# Patient Record
Sex: Male | Born: 1955 | Race: White | Hispanic: No | Marital: Married | Smoking: Never smoker
Health system: Southern US, Community
[De-identification: ages and names within clinical notes are randomized; demographics above are authoritative.]

## PROBLEM LIST (undated history)

## (undated) DIAGNOSIS — E785 Hyperlipidemia, unspecified: Secondary | ICD-10-CM

## (undated) DIAGNOSIS — T7840XA Allergy, unspecified, initial encounter: Secondary | ICD-10-CM

## (undated) DIAGNOSIS — G4730 Sleep apnea, unspecified: Secondary | ICD-10-CM

## (undated) HISTORY — DX: Sleep apnea, unspecified: G47.30

## (undated) HISTORY — DX: Allergy, unspecified, initial encounter: T78.40XA

## (undated) HISTORY — PX: PROSTATE BIOPSY: SHX241

## (undated) HISTORY — PX: REFRACTIVE SURGERY: SHX103

## (undated) HISTORY — PX: CATARACT EXTRACTION, BILATERAL: SHX1313

## (undated) HISTORY — PX: HERNIA REPAIR: SHX51

---

## 1993-10-08 HISTORY — PX: VASECTOMY: SHX75

## 1998-10-08 DIAGNOSIS — G4730 Sleep apnea, unspecified: Secondary | ICD-10-CM

## 1998-10-08 HISTORY — DX: Sleep apnea, unspecified: G47.30

## 2003-10-09 HISTORY — PX: HYDROCELE EXCISION / REPAIR: SUR1145

## 2008-10-08 HISTORY — PX: EYE SURGERY: SHX253

## 2009-10-08 HISTORY — PX: COLONOSCOPY: SHX174

## 2012-01-03 ENCOUNTER — Encounter (INDEPENDENT_AMBULATORY_CARE_PROVIDER_SITE_OTHER): Payer: Federal, State, Local not specified - PPO | Admitting: Ophthalmology

## 2012-01-03 DIAGNOSIS — H43819 Vitreous degeneration, unspecified eye: Secondary | ICD-10-CM

## 2012-01-03 DIAGNOSIS — H35419 Lattice degeneration of retina, unspecified eye: Secondary | ICD-10-CM

## 2012-01-03 DIAGNOSIS — H2510 Age-related nuclear cataract, unspecified eye: Secondary | ICD-10-CM

## 2012-01-03 DIAGNOSIS — H33309 Unspecified retinal break, unspecified eye: Secondary | ICD-10-CM

## 2012-01-18 ENCOUNTER — Ambulatory Visit (INDEPENDENT_AMBULATORY_CARE_PROVIDER_SITE_OTHER): Payer: Federal, State, Local not specified - PPO | Admitting: Ophthalmology

## 2012-01-18 DIAGNOSIS — H33309 Unspecified retinal break, unspecified eye: Secondary | ICD-10-CM

## 2012-02-18 ENCOUNTER — Encounter (INDEPENDENT_AMBULATORY_CARE_PROVIDER_SITE_OTHER): Payer: Federal, State, Local not specified - PPO | Admitting: Ophthalmology

## 2012-02-18 DIAGNOSIS — H33309 Unspecified retinal break, unspecified eye: Secondary | ICD-10-CM

## 2012-02-18 DIAGNOSIS — H2510 Age-related nuclear cataract, unspecified eye: Secondary | ICD-10-CM

## 2012-02-18 DIAGNOSIS — H43819 Vitreous degeneration, unspecified eye: Secondary | ICD-10-CM

## 2012-04-11 ENCOUNTER — Ambulatory Visit: Payer: Federal, State, Local not specified - PPO | Admitting: Cardiology

## 2012-05-19 ENCOUNTER — Ambulatory Visit (INDEPENDENT_AMBULATORY_CARE_PROVIDER_SITE_OTHER): Payer: Federal, State, Local not specified - PPO | Admitting: Ophthalmology

## 2012-05-19 DIAGNOSIS — H2510 Age-related nuclear cataract, unspecified eye: Secondary | ICD-10-CM

## 2012-05-19 DIAGNOSIS — H43819 Vitreous degeneration, unspecified eye: Secondary | ICD-10-CM

## 2012-05-19 DIAGNOSIS — H33309 Unspecified retinal break, unspecified eye: Secondary | ICD-10-CM

## 2013-06-04 ENCOUNTER — Ambulatory Visit (HOSPITAL_BASED_OUTPATIENT_CLINIC_OR_DEPARTMENT_OTHER): Payer: Federal, State, Local not specified - PPO | Admitting: Genetic Counselor

## 2013-06-04 ENCOUNTER — Ambulatory Visit: Payer: Federal, State, Local not specified - PPO | Admitting: Lab

## 2013-06-04 DIAGNOSIS — IMO0002 Reserved for concepts with insufficient information to code with codable children: Secondary | ICD-10-CM

## 2013-06-04 DIAGNOSIS — Z803 Family history of malignant neoplasm of breast: Secondary | ICD-10-CM

## 2013-06-05 ENCOUNTER — Encounter: Payer: Federal, State, Local not specified - PPO | Admitting: Genetic Counselor

## 2013-06-11 ENCOUNTER — Telehealth: Payer: Self-pay | Admitting: Genetic Counselor

## 2013-06-11 NOTE — Telephone Encounter (Signed)
Left good news message.

## 2013-06-12 ENCOUNTER — Encounter: Payer: Self-pay | Admitting: Genetic Counselor

## 2013-06-12 NOTE — Progress Notes (Signed)
Wesley Becker, a 57 y.o. male, was seen with his wife for discussion of his family history of breast cancer and Ahskenazi Jewish ancestry.  He presents to clinic today to discuss the possibility of a genetic predisposition to cancer, and to further clarify his risks, as well as his family members' risks for cancer.   HISTORY OF PRESENT ILLNESS: Wesley Becker is a 57 y.o. male with no personal history of cancer.  Mr. Cuny reports having Ashkenazi Jewish ancestry on both sides of his family.  History reviewed. No pertinent past medical history.  History reviewed. No pertinent past surgical history.  History   Social History  . Marital Status: Married    Spouse Name: N/A    Number of Children: 1  . Years of Education: N/A   Social History Main Topics  . Smoking status: Never Smoker   . Smokeless tobacco: None  . Alcohol Use: None  . Drug Use: None  . Sexual Activity: None   Other Topics Concern  . None   Social History Narrative  . None    FAMILY HISTORY:  We obtained a detailed, 4-generation family history.  Significant diagnoses are listed below: Family History  Problem Relation Age of Onset  . Lymphoma Father     dx in his 58s  . Heart disease Brother 38  . Heart disease Maternal Uncle 46  . Breast cancer Paternal Aunt     dx in her 60s  . Cancer Paternal Grandmother     gall bladder cancer    Patient's maternal ancestors are of [place] descent, and paternal ancestors are of [place] descent. There is reported Ashkenazi Jewish ancestry. There is no known consanguinity.  GENETIC COUNSELING ASSESSMENT: Wesley Becker is a 57 y.o. male with a family history of breast, lymphoma and gallbladder cancer and Ashkenazi Jewish ancestry which somewhat suggestive of a hereditary breast and ovarian cancer syndrome and predisposition to cancer. We, therefore, discussed and recommended the following at today's visit.   DISCUSSION: We reviewed the  characteristics, features and inheritance patterns of hereditary cancer syndromes. We also discussed genetic testing, including the appropriate family members to test, the process of testing, insurance coverage and turn-around-time for results. There are 3 common mutations with the Ashkenazi Jewish population.  Per the literature, about 90% of BRCA mutation found in this population are one of these three.  It is estimated that about 1 in 65 women of Ashkenazi Jewish ancestry with breast cancer have a BRCA mutation.    In order to estimate his chance of having a BRCA mutation, we used statistical models (Penn II and Myriad risk calculator) and laboratory data that take into account his personal medical history, family history and ancestry.  Because each model is different, there can be a lot of variability in the risks they give.  Therefore, these numbers must be considered a rough range and not a precise risk of having a BRCA mutation.  These models estimate that he has approximately a 3-13% chance of having a mutation. Based on this assessment of her family and personal history, genetic testing is recommended.  PLAN: After considering the risks, benefits, and limitations, Wesley Becker provided informed consent to pursue genetic testing and the blood sample will be sent to [facility] for analysis of the multisite BRCA testing. We discussed the implications of a positive, negative and/ or variant of uncertain significance genetic test result. Results should be available within approximately 7-10 days' time, at which point  they will be disclosed by telephone to Wesley Becker, as will any additional recommendations warranted by these results. Wesley Becker will receive a summary of his genetic counseling visit and a copy of his results once available. This information will also be available in Epic. We encouraged Wesley Becker to remain in contact with cancer genetics annually so that we can continuously  update the family history and inform him of any changes in cancer genetics and testing that may be of benefit for his family. Wesley Becker questions were answered to his satisfaction today. Our contact information was provided should additional questions or concerns arise.  The patient was seen for a total of 60 minutes, greater than 50% of which was spent face-to-face counseling.  This plan is being carried out per Dr. Feliz Beam recommendations.  This note will also be sent to the referring provider via the electronic medical record. The patient will be supplied with a summary of this genetic counseling discussion as well as educational information on the discussed hereditary cancer syndromes following the conclusion of their visit.   Patient was discussed with Dr. Drue Becker.   _______________________________________________________________________ For Office Staff:  Number of people involved in session: 2 Was an Intern/ student involved with case: no

## 2013-06-30 ENCOUNTER — Encounter: Payer: Self-pay | Admitting: Genetic Counselor

## 2013-06-30 ENCOUNTER — Telehealth: Payer: Self-pay | Admitting: Genetic Counselor

## 2013-06-30 NOTE — Telephone Encounter (Signed)
Left message to see if they had any qeustions about his genetic test results

## 2014-02-09 ENCOUNTER — Other Ambulatory Visit (HOSPITAL_COMMUNITY): Payer: Self-pay | Admitting: Orthopaedic Surgery

## 2014-02-09 DIAGNOSIS — M25512 Pain in left shoulder: Secondary | ICD-10-CM

## 2014-02-09 DIAGNOSIS — M7542 Impingement syndrome of left shoulder: Secondary | ICD-10-CM

## 2014-02-12 ENCOUNTER — Ambulatory Visit (HOSPITAL_COMMUNITY): Admission: RE | Admit: 2014-02-12 | Payer: Federal, State, Local not specified - PPO | Source: Ambulatory Visit

## 2014-02-17 ENCOUNTER — Ambulatory Visit (HOSPITAL_COMMUNITY)
Admission: RE | Admit: 2014-02-17 | Discharge: 2014-02-17 | Disposition: A | Discharge status: Home / Self Care | Payer: Federal, State, Local not specified - PPO | Source: Ambulatory Visit | Attending: Orthopaedic Surgery | Admitting: Orthopaedic Surgery

## 2014-02-17 DIAGNOSIS — M25519 Pain in unspecified shoulder: Secondary | ICD-10-CM | POA: Insufficient documentation

## 2014-02-17 DIAGNOSIS — M6580 Other synovitis and tenosynovitis, unspecified site: Secondary | ICD-10-CM | POA: Insufficient documentation

## 2014-02-17 DIAGNOSIS — M67919 Unspecified disorder of synovium and tendon, unspecified shoulder: Secondary | ICD-10-CM | POA: Insufficient documentation

## 2014-02-17 DIAGNOSIS — M719 Bursopathy, unspecified: Secondary | ICD-10-CM | POA: Insufficient documentation

## 2014-07-07 ENCOUNTER — Emergency Department (HOSPITAL_COMMUNITY)
Admission: EM | Admit: 2014-07-07 | Discharge: 2014-07-07 | Disposition: A | Discharge status: Home / Self Care | Payer: Federal, State, Local not specified - PPO | Attending: Emergency Medicine | Admitting: Emergency Medicine

## 2014-07-07 ENCOUNTER — Emergency Department (HOSPITAL_COMMUNITY): Payer: Federal, State, Local not specified - PPO

## 2014-07-07 ENCOUNTER — Encounter (HOSPITAL_COMMUNITY): Payer: Self-pay | Admitting: Emergency Medicine

## 2014-07-07 DIAGNOSIS — E785 Hyperlipidemia, unspecified: Secondary | ICD-10-CM | POA: Insufficient documentation

## 2014-07-07 DIAGNOSIS — R079 Chest pain, unspecified: Secondary | ICD-10-CM | POA: Insufficient documentation

## 2014-07-07 DIAGNOSIS — Z79899 Other long term (current) drug therapy: Secondary | ICD-10-CM | POA: Diagnosis not present

## 2014-07-07 DIAGNOSIS — R0789 Other chest pain: Secondary | ICD-10-CM | POA: Diagnosis not present

## 2014-07-07 HISTORY — DX: Hyperlipidemia, unspecified: E78.5

## 2014-07-07 LAB — COMPREHENSIVE METABOLIC PANEL
ALK PHOS: 36 U/L — AB (ref 39–117)
ALT: 63 U/L — ABNORMAL HIGH (ref 0–53)
ANION GAP: 11 (ref 5–15)
AST: 31 U/L (ref 0–37)
Albumin: 4.2 g/dL (ref 3.5–5.2)
BILIRUBIN TOTAL: 0.6 mg/dL (ref 0.3–1.2)
BUN: 13 mg/dL (ref 6–23)
CHLORIDE: 102 meq/L (ref 96–112)
CO2: 28 meq/L (ref 19–32)
Calcium: 9.3 mg/dL (ref 8.4–10.5)
Creatinine, Ser: 0.94 mg/dL (ref 0.50–1.35)
GFR calc non Af Amer: >90 mL/min (ref >90)
GLUCOSE: 121 mg/dL — AB (ref 70–99)
Potassium: 4 mEq/L (ref 3.7–5.3)
Sodium: 141 mEq/L (ref 137–147)
TOTAL PROTEIN: 7.4 g/dL (ref 6.0–8.3)

## 2014-07-07 LAB — CBC WITH DIFFERENTIAL/PLATELET
Basophils Absolute: 0 10*3/uL (ref 0.0–0.1)
Basophils Relative: 0 % (ref 0–1)
Eosinophils Absolute: 0.2 10*3/uL (ref 0.0–0.7)
Eosinophils Relative: 3 % (ref 0–5)
HCT: 42.1 % (ref 39.0–52.0)
HEMOGLOBIN: 14.6 g/dL (ref 13.0–17.0)
LYMPHS ABS: 2.1 10*3/uL (ref 0.7–4.0)
LYMPHS PCT: 33 % (ref 12–46)
MCH: 29.1 pg (ref 26.0–34.0)
MCHC: 34.7 g/dL (ref 30.0–36.0)
MCV: 83.9 fL (ref 78.0–100.0)
MONOS PCT: 6 % (ref 3–12)
Monocytes Absolute: 0.4 10*3/uL (ref 0.1–1.0)
NEUTROS ABS: 3.6 10*3/uL (ref 1.7–7.7)
Neutrophils Relative %: 58 % (ref 43–77)
PLATELETS: 174 10*3/uL (ref 150–400)
RBC: 5.02 MIL/uL (ref 4.22–5.81)
RDW: 12.9 % (ref 11.5–15.5)
WBC: 6.3 10*3/uL (ref 4.0–10.5)

## 2014-07-07 LAB — TROPONIN I

## 2014-07-07 MED ORDER — KETOROLAC TROMETHAMINE 30 MG/ML IJ SOLN
30.0000 mg | Freq: Once | INTRAMUSCULAR | Status: DC
  Filled 2014-07-07: qty 1

## 2014-07-07 MED ORDER — IOHEXOL 350 MG/ML SOLN
100.0000 mL | Freq: Once | INTRAVENOUS | Status: AC | PRN
  Administered 2014-07-07: 100 mL via INTRAVENOUS

## 2014-07-07 MED ORDER — SODIUM CHLORIDE 0.9 % IV SOLN
INTRAVENOUS | Status: DC
  Administered 2014-07-07: 21:00:00 via INTRAVENOUS

## 2014-07-07 NOTE — ED Notes (Signed)
Pt out of room for CT 

## 2014-07-07 NOTE — ED Notes (Signed)
Pt reports sudden onset R side chest pain while sitting down, not tender to palpation. No SOB, no weakness, no N/V. Pt reports taking 162 mg aspirin prior to arrival.

## 2014-07-07 NOTE — Discharge Instructions (Signed)

## 2014-07-07 NOTE — ED Provider Notes (Signed)
CSN: [id number]     Arrival date & time 07/07/14  2036 History   First MD Initiated Contact with Patient 07/07/14 2044     Chief Complaint  Patient presents with  . Chest Pain     (Consider location/radiation/quality/duration/timing/severity/associated sxs/prior Treatment) HPI Comments: Patient here with constant right-sided chest pain or that began approximately 5 hours prior to arrival. Patient was sitting down when he developed sharp pain that was worse with movement and taking a deep breath. Denies any recent leg pain or swelling. Denies any dyspnea. No fever or cough. Denies any associated diaphoresis or nausea or vomiting. Symptoms have been improving since he took aspirin. No prior history of chest trauma. No rashes noted.  Patient is a 58 y.o. male presenting with chest pain. The history is provided by the patient.  Chest Pain   Past Medical History  Diagnosis Date  . Hyperlipidemia    Past Surgical History  Procedure Laterality Date  . Prostate biopsy    . Hernia repair    . Refractive surgery     Family History  Problem Relation Age of Onset  . Lymphoma Father     dx in his 42s  . Heart disease Brother 27  . Heart disease Maternal Uncle 46  . Breast cancer Paternal Aunt     dx in her 8s  . Cancer Paternal Grandmother     gall bladder cancer   History  Substance Use Topics  . Smoking status: Never Smoker   . Smokeless tobacco: Not on file  . Alcohol Use: Yes     Comment: rarely    Review of Systems  Cardiovascular: Positive for chest pain.  All other systems reviewed and are negative.     Allergies  Review of patient's allergies indicates no known allergies.  Home Medications   Prior to Admission medications   Medication Sig Start Date End Date Taking? Authorizing Provider  simvastatin (ZOCOR) 40 MG tablet Take 40 mg by mouth at bedtime.   Yes Historical Provider, MD  tacrolimus (PROTOPIC) 0.1 % ointment Apply 1 drop topically daily as needed  (under eyes for runny eyes.).   Yes Historical Provider, MD  traZODone (DESYREL) 50 MG tablet Take 50 mg by mouth at bedtime.   Yes Historical Provider, MD  zolpidem (AMBIEN) 5 MG tablet Take 5 mg by mouth at bedtime as needed for sleep.   Yes Historical Provider, MD   BP 134/91  Pulse 79  Temp(Src) 97.9 F (36.6 C) (Oral)  Resp 18  Ht 6' 3.5" (1.918 m)  Wt 218 lb (98.884 kg)  BMI 26.88 kg/m2  SpO2 100% Physical Exam  Nursing note and vitals reviewed. Constitutional: He is oriented to person, place, and time. He appears well-developed and well-nourished.  Non-toxic appearance. No distress.  HENT:  Head: Normocephalic and atraumatic.  Eyes: Conjunctivae, EOM and lids are normal. Pupils are equal, round, and reactive to light.  Neck: Normal range of motion. Neck supple. No tracheal deviation present. No mass present.  Cardiovascular: Normal rate, regular rhythm and normal heart sounds.  Exam reveals no gallop.   No murmur heard. Pulmonary/Chest: Effort normal and breath sounds normal. No stridor. No respiratory distress. He has no decreased breath sounds. He has no wheezes. He has no rhonchi. He has no rales.  Abdominal: Soft. Normal appearance and bowel sounds are normal. He exhibits no distension. There is no tenderness. There is no rebound and no CVA tenderness.  Musculoskeletal: Normal range of motion. He exhibits  no edema and no tenderness.  Neurological: He is alert and oriented to person, place, and time. He has normal strength. No cranial nerve deficit or sensory deficit. GCS eye subscore is 4. GCS verbal subscore is 5. GCS motor subscore is 6.  Skin: Skin is warm and dry. No abrasion and no rash noted.  Psychiatric: He has a normal mood and affect. His speech is normal and behavior is normal.    ED Course  Procedures (including critical care time) Labs Review Labs Reviewed  CBC WITH DIFFERENTIAL  TROPONIN I  COMPREHENSIVE METABOLIC PANEL    Imaging Review No results  found.   EKG Interpretation   Date/Time:  Wednesday July 07 2014 20:45:21 EDT Ventricular Rate:  79 PR Interval:  178 QRS Duration: 100 QT Interval:  406 QTC Calculation: 465 R Axis:   -20 Text Interpretation:  Sinus rhythm Borderline left axis deviation RSR' in  V1 or V2, right VCD or RVH Confirmed by Bristol Soy  MD, Sherriann Szuch ([id number]) on  07/07/2014 9:19:22 PM      MDM   Final diagnoses:  None   Patient with negative chest CT for PE here. Troponin is negative. Symptoms have subsided. I do not think that this represents ACS. Symptoms are atypical and will be given cardiology referral.     Leota Jacobsen, MD 07/07/14 2244

## 2014-08-18 ENCOUNTER — Encounter: Payer: Self-pay | Admitting: *Deleted

## 2014-08-25 ENCOUNTER — Ambulatory Visit (INDEPENDENT_AMBULATORY_CARE_PROVIDER_SITE_OTHER): Payer: Federal, State, Local not specified - PPO | Admitting: Cardiology

## 2014-08-25 ENCOUNTER — Encounter: Payer: Self-pay | Admitting: Cardiology

## 2014-08-25 VITALS — BP 110/78 | HR 74 | Ht 75.0 in | Wt 219.0 lb

## 2014-08-25 DIAGNOSIS — R0789 Other chest pain: Secondary | ICD-10-CM

## 2014-08-25 DIAGNOSIS — R072 Precordial pain: Secondary | ICD-10-CM

## 2014-08-25 NOTE — Progress Notes (Signed)
HPI The patient presents for evaluation of chest pain. He had this on the last day in September. This was a mid discomfort that was somewhat sharp. It occurred at rest. It was 4/10. He did not have radiation although he has some bursitis in his left shoulder so it is difficult to assess. There was no associated nausea vomiting or diaphoresis. He was not particularly short of breath. He may have had this kind of discomfort infrequently in the past. He did present to the emergency room and I reviewed these records. One enzyme was negative. EKG was not acute. Pulmonary CT angiogram demonstrated no pulmonary embolism. He had resolution of his symptoms spontaneously after about 5 hours he's had no recurrence of this. He is able to do things like walk up and down stairs quite frequently. He has been under increased stress as he is taking care of his wife who has stage IV breast cancer.  No Known Allergies  Current Outpatient Prescriptions  Medication Sig Dispense Refill  . AFLURIA PRESERVATIVE FREE 0.5 ML SUSY     . simvastatin (ZOCOR) 40 MG tablet Take 40 mg by mouth at bedtime.    . tacrolimus (PROTOPIC) 0.1 % ointment Apply 1 drop topically daily as needed (under eyes for runny eyes.).    Marland Kitchen traZODone (DESYREL) 50 MG tablet Take 50 mg by mouth at bedtime.    Marland Kitchen zolpidem (AMBIEN) 5 MG tablet Take 5 mg by mouth at bedtime as needed for sleep.     No current facility-administered medications for this visit.    Past Medical History  Diagnosis Date  . Hyperlipidemia     Past Surgical History  Procedure Laterality Date  . Prostate biopsy    . Hernia repair    . Refractive surgery      Family History  Problem Relation Age of Onset  . Lymphoma Father     dx in his 64s  . Heart disease Brother 23  . Heart disease Maternal Uncle 46  . Breast cancer Paternal Aunt     dx in her 21s  . Cancer Paternal Grandmother     gall bladder cancer    History   Social History  . Marital Status:  Married    Spouse Name: N/A    Number of Children: 1  . Years of Education: N/A   Occupational History  . Not on file.   Social History Main Topics  . Smoking status: Never Smoker   . Smokeless tobacco: Not on file  . Alcohol Use: Yes     Comment: rarely  . Drug Use: No  . Sexual Activity: Not on file   Other Topics Concern  . Not on file   Social History Narrative    ROS:  As stated in the HPI and negative for all other systems.  PHYSICAL EXAM BP 110/78 mmHg  Pulse 74  Ht 6\' 3"  (1.905 m)  Wt 219 lb (99.338 kg)  BMI 27.37 kg/m2  GENERAL:  Well appearing HEENT:  Pupils equal round and reactive, fundi not visualized, oral mucosa unremarkable NECK:  No jugular venous distention, waveform within normal limits, carotid upstroke brisk and symmetric, no bruits, no thyromegaly LYMPHATICS:  No cervical, inguinal adenopathy LUNGS:  Clear to auscultation bilaterally BACK:  No CVA tenderness CHEST:  Unremarkable HEART:  PMI not displaced or sustained,S1 and S2 within normal limits, no S3, no S4, no clicks, no rubs, no murmurs ABD:  Flat, positive bowel sounds normal in frequency in pitch,  no bruits, no rebound, no guarding, no midline pulsatile mass, no hepatomegaly, no splenomegaly EXT:  2 plus pulses throughout, no edema, no cyanosis no clubbing SKIN:  No rashes no nodules NEURO:  Cranial nerves II through XII grossly intact, motor grossly intact throughout PSYCH:  Cognitively intact, oriented to person place and time   EKG:   Sinus rhythm, rate 74, leftward axis, early transition, baseline artifact, no acute ST-T wave changes. 08/25/2014   ASSESSMENT AND PLAN  CHEST PAIN:  This was atypical. However, he does have a significant family history. Given this screening stress testing is indicated. I will bring the patient back for a POET (Plain Old Exercise Test). This will allow me to screen for obstructive coronary disease, risk stratify and very importantly provide a  prescription for exercise.  I did discuss with him that I would periodically screen him with treadmill test and perhaps calcium scores given his family history. We discussed the importance of primary risk reduction.  DYSLIPIDEMIA:  I reviewed his LDL from May which demonstrated a level of 106 and HDL 40. He will continue on the meds as listed.

## 2014-08-25 NOTE — Patient Instructions (Signed)
Your physician recommends that you schedule a follow-up appointment in: one year with Dr. Percival Spanish  We are ordering a treadmill test for you to get done

## 2014-09-16 ENCOUNTER — Telehealth (HOSPITAL_COMMUNITY): Payer: Self-pay

## 2014-09-16 NOTE — Telephone Encounter (Signed)
Encounter complete. 

## 2014-09-21 ENCOUNTER — Ambulatory Visit (HOSPITAL_COMMUNITY)
Admission: RE | Admit: 2014-09-21 | Discharge: 2014-09-21 | Disposition: A | Discharge status: Home / Self Care | Payer: Federal, State, Local not specified - PPO | Source: Ambulatory Visit | Attending: Cardiology | Admitting: Cardiology

## 2014-09-21 DIAGNOSIS — R079 Chest pain, unspecified: Secondary | ICD-10-CM | POA: Diagnosis present

## 2014-09-21 DIAGNOSIS — R0789 Other chest pain: Secondary | ICD-10-CM

## 2014-09-21 NOTE — Procedures (Signed)
Exercise Treadmill Test  Test  Exercise Tolerance Test Ordering MD: Marijo File, MD  Interpreting MD:   Unique Test No:1 Treadmill:  1  Indication for ETT: chest pain - rule out ischemia  Contraindication to ETT: No   Stress Modality: exercise - treadmill  Cardiac Imaging Performed: non   Protocol: standard Bruce - maximal  Max BP:  155/75  Max MPHR (bpm):  164 85% MPR (bpm):  139  MPHR obtained (bpm): 181 % MPHR obtained:  111  Reached 85% MPHR (min:sec):  9:10 Total Exercise Time (min-sec):  12:11  Workload in METS: 13.70 Borg Scale: Not recorded  Reason ETT Terminated:  fatigue    ST Segment Analysis At Rest: normal ST segments - no evidence of significant ST depression With Exercise: no evidence of significant ST depression  Other Information Arrhythmia:  No Angina during ETT:  absent (0) Quality of ETT:  diagnostic  ETT Interpretation:  normal - no evidence of ischemia by ST analysis  Comments: The patient had an good exercise tolerance.  There was no chest pain.  There was an appropriate level of dyspnea.  There were no arrhythmias, a normal heart rate response and normal BP response.  There were no ischemic ST T wave changes and a normal heart rate recovery.  Recommendations: Negative adequate POET (Plain Old Exercise Treadmill).  No further cardiovascular testing.

## 2015-04-14 ENCOUNTER — Other Ambulatory Visit: Payer: Self-pay | Admitting: Gastroenterology

## 2015-07-12 ENCOUNTER — Encounter (HOSPITAL_COMMUNITY): Payer: Self-pay

## 2015-07-12 ENCOUNTER — Ambulatory Visit (HOSPITAL_COMMUNITY): Admit: 2015-07-12 | Payer: Self-pay | Admitting: Gastroenterology

## 2015-07-12 SURGERY — COLONOSCOPY WITH PROPOFOL
Anesthesia: Monitor Anesthesia Care

## 2015-09-20 ENCOUNTER — Telehealth: Payer: Self-pay | Admitting: Cardiology

## 2015-09-20 NOTE — Telephone Encounter (Signed)
Returned call to patient.He stated he has appointment with Dr.Hochrein 09/22/15.Stated he wanted to ask Dr.Hochrein if he needs to keep appointment since he is doing good,no chest pain.Treadmill was normal last year.Stated he wife died in 02/21/2023 and he was under a lot of stress last year.Message sent to [place].

## 2015-09-20 NOTE — Telephone Encounter (Signed)
Please call,pt says he have some questions. He said he is not sure if he needs his appointment on Thursday.

## 2015-09-21 NOTE — Telephone Encounter (Signed)
Mr. Wesley Becker is calling back to see if he should come in to see Dr. Percival Spanish on 09/22/15.Marland Kitchen Please call him on his cell at  [phone]  thanks

## 2015-09-21 NOTE — Telephone Encounter (Signed)
Looks like the appt was already canceled.  I am fine with that.

## 2015-09-22 ENCOUNTER — Ambulatory Visit: Payer: Federal, State, Local not specified - PPO | Admitting: Cardiology

## 2016-01-24 DIAGNOSIS — K080 Exfoliation of teeth due to systemic causes: Secondary | ICD-10-CM | POA: Diagnosis not present

## 2016-02-07 DIAGNOSIS — R972 Elevated prostate specific antigen [PSA]: Secondary | ICD-10-CM | POA: Diagnosis not present

## 2016-02-13 DIAGNOSIS — R972 Elevated prostate specific antigen [PSA]: Secondary | ICD-10-CM | POA: Diagnosis not present

## 2016-02-13 DIAGNOSIS — Z0000 Encounter for general adult medical examination without abnormal findings: Secondary | ICD-10-CM | POA: Diagnosis not present

## 2016-02-14 ENCOUNTER — Other Ambulatory Visit (HOSPITAL_COMMUNITY): Payer: Self-pay | Admitting: Urology

## 2016-02-14 DIAGNOSIS — R972 Elevated prostate specific antigen [PSA]: Secondary | ICD-10-CM

## 2016-02-15 DIAGNOSIS — R972 Elevated prostate specific antigen [PSA]: Secondary | ICD-10-CM | POA: Diagnosis not present

## 2016-02-28 ENCOUNTER — Ambulatory Visit (HOSPITAL_COMMUNITY): Payer: Federal, State, Local not specified - PPO

## 2016-03-13 DIAGNOSIS — E7800 Pure hypercholesterolemia, unspecified: Secondary | ICD-10-CM | POA: Diagnosis not present

## 2016-03-13 DIAGNOSIS — Z0000 Encounter for general adult medical examination without abnormal findings: Secondary | ICD-10-CM | POA: Diagnosis not present

## 2016-04-11 DIAGNOSIS — L237 Allergic contact dermatitis due to plants, except food: Secondary | ICD-10-CM | POA: Diagnosis not present

## 2016-04-13 DIAGNOSIS — L218 Other seborrheic dermatitis: Secondary | ICD-10-CM | POA: Diagnosis not present

## 2016-04-13 DIAGNOSIS — D2271 Melanocytic nevi of right lower limb, including hip: Secondary | ICD-10-CM | POA: Diagnosis not present

## 2016-04-13 DIAGNOSIS — L821 Other seborrheic keratosis: Secondary | ICD-10-CM | POA: Diagnosis not present

## 2016-05-07 DIAGNOSIS — K080 Exfoliation of teeth due to systemic causes: Secondary | ICD-10-CM | POA: Diagnosis not present

## 2016-05-21 DIAGNOSIS — R972 Elevated prostate specific antigen [PSA]: Secondary | ICD-10-CM | POA: Diagnosis not present

## 2016-05-29 DIAGNOSIS — M76821 Posterior tibial tendinitis, right leg: Secondary | ICD-10-CM | POA: Diagnosis not present

## 2016-06-20 DIAGNOSIS — H31019 Macula scars of posterior pole (postinflammatory) (post-traumatic), unspecified eye: Secondary | ICD-10-CM | POA: Diagnosis not present

## 2016-06-20 DIAGNOSIS — H43813 Vitreous degeneration, bilateral: Secondary | ICD-10-CM | POA: Diagnosis not present

## 2016-06-20 DIAGNOSIS — H25013 Cortical age-related cataract, bilateral: Secondary | ICD-10-CM | POA: Diagnosis not present

## 2016-06-20 DIAGNOSIS — H2513 Age-related nuclear cataract, bilateral: Secondary | ICD-10-CM | POA: Diagnosis not present

## 2016-06-20 DIAGNOSIS — D3131 Benign neoplasm of right choroid: Secondary | ICD-10-CM | POA: Diagnosis not present

## 2016-06-20 DIAGNOSIS — H35413 Lattice degeneration of retina, bilateral: Secondary | ICD-10-CM | POA: Diagnosis not present

## 2016-08-15 DIAGNOSIS — K080 Exfoliation of teeth due to systemic causes: Secondary | ICD-10-CM | POA: Diagnosis not present

## 2016-08-16 DIAGNOSIS — K080 Exfoliation of teeth due to systemic causes: Secondary | ICD-10-CM | POA: Diagnosis not present

## 2016-09-13 DIAGNOSIS — H43393 Other vitreous opacities, bilateral: Secondary | ICD-10-CM | POA: Diagnosis not present

## 2016-09-13 DIAGNOSIS — H25013 Cortical age-related cataract, bilateral: Secondary | ICD-10-CM | POA: Diagnosis not present

## 2016-09-13 DIAGNOSIS — H2513 Age-related nuclear cataract, bilateral: Secondary | ICD-10-CM | POA: Diagnosis not present

## 2016-11-14 DIAGNOSIS — H2513 Age-related nuclear cataract, bilateral: Secondary | ICD-10-CM | POA: Diagnosis not present

## 2016-11-27 DIAGNOSIS — H2512 Age-related nuclear cataract, left eye: Secondary | ICD-10-CM | POA: Diagnosis not present

## 2016-12-04 DIAGNOSIS — H2511 Age-related nuclear cataract, right eye: Secondary | ICD-10-CM | POA: Diagnosis not present

## 2016-12-17 DIAGNOSIS — K080 Exfoliation of teeth due to systemic causes: Secondary | ICD-10-CM | POA: Diagnosis not present

## 2017-02-05 DIAGNOSIS — N400 Enlarged prostate without lower urinary tract symptoms: Secondary | ICD-10-CM | POA: Diagnosis not present

## 2017-02-13 DIAGNOSIS — N400 Enlarged prostate without lower urinary tract symptoms: Secondary | ICD-10-CM | POA: Diagnosis not present

## 2017-02-13 DIAGNOSIS — R972 Elevated prostate specific antigen [PSA]: Secondary | ICD-10-CM | POA: Diagnosis not present

## 2017-03-13 DIAGNOSIS — H2513 Age-related nuclear cataract, bilateral: Secondary | ICD-10-CM | POA: Diagnosis not present

## 2017-03-14 DIAGNOSIS — F418 Other specified anxiety disorders: Secondary | ICD-10-CM | POA: Diagnosis not present

## 2017-03-14 DIAGNOSIS — Z0000 Encounter for general adult medical examination without abnormal findings: Secondary | ICD-10-CM | POA: Diagnosis not present

## 2017-03-14 DIAGNOSIS — F5104 Psychophysiologic insomnia: Secondary | ICD-10-CM | POA: Diagnosis not present

## 2017-03-14 DIAGNOSIS — E7800 Pure hypercholesterolemia, unspecified: Secondary | ICD-10-CM | POA: Diagnosis not present

## 2017-04-03 DIAGNOSIS — K080 Exfoliation of teeth due to systemic causes: Secondary | ICD-10-CM | POA: Diagnosis not present

## 2017-04-15 DIAGNOSIS — L821 Other seborrheic keratosis: Secondary | ICD-10-CM | POA: Diagnosis not present

## 2017-04-15 DIAGNOSIS — L570 Actinic keratosis: Secondary | ICD-10-CM | POA: Diagnosis not present

## 2017-04-15 DIAGNOSIS — L738 Other specified follicular disorders: Secondary | ICD-10-CM | POA: Diagnosis not present

## 2017-04-15 DIAGNOSIS — L218 Other seborrheic dermatitis: Secondary | ICD-10-CM | POA: Diagnosis not present

## 2017-06-24 DIAGNOSIS — H35413 Lattice degeneration of retina, bilateral: Secondary | ICD-10-CM | POA: Diagnosis not present

## 2017-06-24 DIAGNOSIS — H43813 Vitreous degeneration, bilateral: Secondary | ICD-10-CM | POA: Diagnosis not present

## 2017-06-24 DIAGNOSIS — H43393 Other vitreous opacities, bilateral: Secondary | ICD-10-CM | POA: Diagnosis not present

## 2017-06-24 DIAGNOSIS — H31012 Macula scars of posterior pole (postinflammatory) (post-traumatic), left eye: Secondary | ICD-10-CM | POA: Diagnosis not present

## 2017-06-24 DIAGNOSIS — H26493 Other secondary cataract, bilateral: Secondary | ICD-10-CM | POA: Diagnosis not present

## 2017-06-24 DIAGNOSIS — D3131 Benign neoplasm of right choroid: Secondary | ICD-10-CM | POA: Diagnosis not present

## 2017-07-04 DIAGNOSIS — K080 Exfoliation of teeth due to systemic causes: Secondary | ICD-10-CM | POA: Diagnosis not present

## 2017-09-09 DIAGNOSIS — K080 Exfoliation of teeth due to systemic causes: Secondary | ICD-10-CM | POA: Diagnosis not present

## 2017-10-14 DIAGNOSIS — K080 Exfoliation of teeth due to systemic causes: Secondary | ICD-10-CM | POA: Diagnosis not present

## 2017-12-31 ENCOUNTER — Encounter: Payer: Self-pay | Admitting: Podiatry

## 2017-12-31 ENCOUNTER — Other Ambulatory Visit: Payer: Self-pay | Admitting: Podiatry

## 2017-12-31 ENCOUNTER — Ambulatory Visit: Payer: Federal, State, Local not specified - PPO | Admitting: Podiatry

## 2017-12-31 ENCOUNTER — Ambulatory Visit (INDEPENDENT_AMBULATORY_CARE_PROVIDER_SITE_OTHER): Payer: Federal, State, Local not specified - PPO

## 2017-12-31 VITALS — BP 119/89 | HR 73 | Resp 16

## 2017-12-31 DIAGNOSIS — M778 Other enthesopathies, not elsewhere classified: Secondary | ICD-10-CM

## 2017-12-31 DIAGNOSIS — L603 Nail dystrophy: Secondary | ICD-10-CM

## 2017-12-31 DIAGNOSIS — M7751 Other enthesopathy of right foot: Secondary | ICD-10-CM | POA: Diagnosis not present

## 2017-12-31 DIAGNOSIS — M779 Enthesopathy, unspecified: Principal | ICD-10-CM

## 2017-12-31 NOTE — Progress Notes (Signed)
  Subjective:  Patient ID: Wesley Becker, male    DOB: January 31, 1956,  MRN: [id number] HPI Chief Complaint  Patient presents with  . Nail Problem    Toenails - 1st bilateral and 3rd left - patient concerned about discoloration of nails, would like them to look better by summer  . Foot Pain    Dorsal midfoot and forefoot right (some posterior heel) - sharp pains occasionally x several months, active wtih exercise and usually makes worse    62 y.o. male presents with the above complaint.   ROS: Denies fever chills nausea vomiting muscle aches pains shortness of breath chest pain and headache.  Past Medical History:  Diagnosis Date  . Hyperlipidemia   . Sleep apnea    Couldn't use CPAP   Past Surgical History:  Procedure Laterality Date  . HERNIA REPAIR     bilateral inguinal  . PROSTATE BIOPSY    . REFRACTIVE SURGERY      Current Outpatient Medications:  .  Loratadine (CLARITIN PO), Take by mouth., Disp: , Rfl:  .  Multiple Vitamin (MULTIVITAMIN) capsule, Take 1 capsule by mouth daily., Disp: , Rfl:  .  simvastatin (ZOCOR) 40 MG tablet, Take 40 mg by mouth at bedtime., Disp: , Rfl:  .  traZODone (DESYREL) 50 MG tablet, Take 50 mg by mouth at bedtime., Disp: , Rfl:  .  zolpidem (AMBIEN) 5 MG tablet, Take 5 mg by mouth at bedtime as needed for sleep., Disp: , Rfl:   No Known Allergies Review of Systems Objective:   Vitals:   12/31/17 0950  BP: 119/89  Pulse: 73  Resp: 16    General: Well developed, nourished, in no acute distress, alert and oriented x3   Dermatological: Skin is warm, dry and supple bilateral. Nails x 10 are well maintained; remaining integument appears unremarkable at this time. There are no open sores, no preulcerative lesions, no rash or signs of infection present.  Vascular: Dorsalis Pedis artery and Posterior Tibial artery pedal pulses are 2/4 bilateral with immedate capillary fill time. Pedal hair growth present. No varicosities and no lower extremity  edema present bilateral.   Neruologic: Grossly intact via light touch bilateral. Vibratory intact via tuning fork bilateral. Protective threshold with Semmes Wienstein monofilament intact to all pedal sites bilateral. Patellar and Achilles deep tendon reflexes 2+ bilateral. No Babinski or clonus noted bilateral.   Musculoskeletal: No gross boney pedal deformities bilateral. No pain, crepitus, or limitation noted with foot and ankle range of motion bilateral. Muscular strength 5/5 in all groups tested bilateral.  He has mild tenderness on frontal plane range of motion and on palpation of the sinus tarsi of the right foot.  He has pain on maximal eversion of the subtalar joint right foot.  Gait: Unassisted, Nonantalgic.    Radiographs:  3 radiographs of the right foot taken today demonstrates an osseously mature individual with relatively normal architecture with exception of the subtalar joint of the right heel.  The posterior facet does demonstrate some early arthritic changes.  Assessment & Plan:   Assessment: Dystrophic nail cannot rule out onychomycosis.    Plan: Discussed etiology pathology conservative versus surgical therapies.  Samples of the nail were taken today for pathologic evaluation.  We also discussed injecting the subtalar joint through the sinus tarsi today he declined we also discussed the possible need for orthotics he declined follow-up with him once his pathology has returned.     Max T. [place], [place]

## 2018-01-07 ENCOUNTER — Telehealth: Payer: Self-pay | Admitting: *Deleted

## 2018-01-08 ENCOUNTER — Telehealth: Payer: Self-pay | Admitting: *Deleted

## 2018-01-08 DIAGNOSIS — J302 Other seasonal allergic rhinitis: Secondary | ICD-10-CM | POA: Diagnosis not present

## 2018-01-08 DIAGNOSIS — R972 Elevated prostate specific antigen [PSA]: Secondary | ICD-10-CM | POA: Diagnosis not present

## 2018-01-08 DIAGNOSIS — Z0000 Encounter for general adult medical examination without abnormal findings: Secondary | ICD-10-CM | POA: Diagnosis not present

## 2018-01-08 NOTE — Telephone Encounter (Signed)
Pt called for fungal culture results for specimen collected 12/31/2017. I will contact [facility] tomorrow.

## 2018-01-09 NOTE — Telephone Encounter (Signed)
Left message informing pt I had called [facility] and the test were still being run and it could take up to 4-6 week before they returned and we would call with instructions once results were reviewed.

## 2018-01-09 NOTE — Telephone Encounter (Signed)
Wesley Becker states fungal culture is still being culture.

## 2018-01-09 NOTE — Telephone Encounter (Signed)
Pt called for results of fungal culture performed 12/31/2017.

## 2018-01-20 ENCOUNTER — Telehealth: Payer: Self-pay | Admitting: *Deleted

## 2018-01-20 DIAGNOSIS — K080 Exfoliation of teeth due to systemic causes: Secondary | ICD-10-CM | POA: Diagnosis not present

## 2018-01-20 NOTE — Telephone Encounter (Signed)
I informed pt of Dr. Hyatt's review of results. 

## 2018-01-20 NOTE — Telephone Encounter (Signed)
-----   Message from Garrel Ridgel, [place] sent at 01/15/2018 12:12 PM EDT ----- Negative for fungus but positive for bacteria

## 2018-03-12 DIAGNOSIS — Z0000 Encounter for general adult medical examination without abnormal findings: Secondary | ICD-10-CM | POA: Diagnosis not present

## 2018-03-12 DIAGNOSIS — R972 Elevated prostate specific antigen [PSA]: Secondary | ICD-10-CM | POA: Diagnosis not present

## 2018-03-19 DIAGNOSIS — Z0000 Encounter for general adult medical examination without abnormal findings: Secondary | ICD-10-CM | POA: Diagnosis not present

## 2018-03-19 DIAGNOSIS — F5104 Psychophysiologic insomnia: Secondary | ICD-10-CM | POA: Diagnosis not present

## 2018-03-19 DIAGNOSIS — E7800 Pure hypercholesterolemia, unspecified: Secondary | ICD-10-CM | POA: Diagnosis not present

## 2018-03-19 DIAGNOSIS — Z23 Encounter for immunization: Secondary | ICD-10-CM | POA: Diagnosis not present

## 2018-04-16 DIAGNOSIS — L814 Other melanin hyperpigmentation: Secondary | ICD-10-CM | POA: Diagnosis not present

## 2018-04-16 DIAGNOSIS — L570 Actinic keratosis: Secondary | ICD-10-CM | POA: Diagnosis not present

## 2018-04-16 DIAGNOSIS — D1801 Hemangioma of skin and subcutaneous tissue: Secondary | ICD-10-CM | POA: Diagnosis not present

## 2018-04-16 DIAGNOSIS — D485 Neoplasm of uncertain behavior of skin: Secondary | ICD-10-CM | POA: Diagnosis not present

## 2018-04-16 DIAGNOSIS — D225 Melanocytic nevi of trunk: Secondary | ICD-10-CM | POA: Diagnosis not present

## 2018-04-16 DIAGNOSIS — L821 Other seborrheic keratosis: Secondary | ICD-10-CM | POA: Diagnosis not present

## 2018-04-22 DIAGNOSIS — K080 Exfoliation of teeth due to systemic causes: Secondary | ICD-10-CM | POA: Diagnosis not present

## 2018-06-02 DIAGNOSIS — K080 Exfoliation of teeth due to systemic causes: Secondary | ICD-10-CM | POA: Diagnosis not present

## 2018-06-30 DIAGNOSIS — H3589 Other specified retinal disorders: Secondary | ICD-10-CM | POA: Diagnosis not present

## 2018-06-30 DIAGNOSIS — H43393 Other vitreous opacities, bilateral: Secondary | ICD-10-CM | POA: Diagnosis not present

## 2018-06-30 DIAGNOSIS — H26493 Other secondary cataract, bilateral: Secondary | ICD-10-CM | POA: Diagnosis not present

## 2018-06-30 DIAGNOSIS — Z961 Presence of intraocular lens: Secondary | ICD-10-CM | POA: Diagnosis not present

## 2018-08-18 DIAGNOSIS — K080 Exfoliation of teeth due to systemic causes: Secondary | ICD-10-CM | POA: Diagnosis not present

## 2018-09-16 ENCOUNTER — Ambulatory Visit: Payer: Federal, State, Local not specified - PPO | Admitting: Podiatry

## 2018-11-20 DIAGNOSIS — K080 Exfoliation of teeth due to systemic causes: Secondary | ICD-10-CM | POA: Diagnosis not present

## 2018-12-15 DIAGNOSIS — K080 Exfoliation of teeth due to systemic causes: Secondary | ICD-10-CM | POA: Diagnosis not present

## 2019-03-24 DIAGNOSIS — R972 Elevated prostate specific antigen [PSA]: Secondary | ICD-10-CM | POA: Diagnosis not present

## 2019-03-24 DIAGNOSIS — E7800 Pure hypercholesterolemia, unspecified: Secondary | ICD-10-CM | POA: Diagnosis not present

## 2019-03-24 DIAGNOSIS — Z0000 Encounter for general adult medical examination without abnormal findings: Secondary | ICD-10-CM | POA: Diagnosis not present

## 2019-04-20 DIAGNOSIS — D2262 Melanocytic nevi of left upper limb, including shoulder: Secondary | ICD-10-CM | POA: Diagnosis not present

## 2019-04-20 DIAGNOSIS — D2261 Melanocytic nevi of right upper limb, including shoulder: Secondary | ICD-10-CM | POA: Diagnosis not present

## 2019-04-20 DIAGNOSIS — B078 Other viral warts: Secondary | ICD-10-CM | POA: Diagnosis not present

## 2019-04-20 DIAGNOSIS — D225 Melanocytic nevi of trunk: Secondary | ICD-10-CM | POA: Diagnosis not present

## 2019-04-20 DIAGNOSIS — D1801 Hemangioma of skin and subcutaneous tissue: Secondary | ICD-10-CM | POA: Diagnosis not present

## 2019-04-20 DIAGNOSIS — L570 Actinic keratosis: Secondary | ICD-10-CM | POA: Diagnosis not present

## 2019-06-17 DIAGNOSIS — H33312 Horseshoe tear of retina without detachment, left eye: Secondary | ICD-10-CM | POA: Diagnosis not present

## 2019-06-17 DIAGNOSIS — Z961 Presence of intraocular lens: Secondary | ICD-10-CM | POA: Diagnosis not present

## 2019-06-17 DIAGNOSIS — H35413 Lattice degeneration of retina, bilateral: Secondary | ICD-10-CM | POA: Diagnosis not present

## 2019-06-17 DIAGNOSIS — D3131 Benign neoplasm of right choroid: Secondary | ICD-10-CM | POA: Diagnosis not present

## 2019-06-17 DIAGNOSIS — H15831 Staphyloma posticum, right eye: Secondary | ICD-10-CM | POA: Diagnosis not present

## 2019-06-17 DIAGNOSIS — H26493 Other secondary cataract, bilateral: Secondary | ICD-10-CM | POA: Diagnosis not present

## 2019-06-30 ENCOUNTER — Ambulatory Visit: Payer: Self-pay

## 2019-06-30 ENCOUNTER — Other Ambulatory Visit: Payer: Self-pay

## 2019-06-30 ENCOUNTER — Ambulatory Visit: Payer: Federal, State, Local not specified - PPO | Admitting: Orthopaedic Surgery

## 2019-06-30 ENCOUNTER — Encounter: Payer: Self-pay | Admitting: Orthopaedic Surgery

## 2019-06-30 VITALS — BP 135/95 | HR 70 | Ht 75.5 in | Wt 217.0 lb

## 2019-06-30 DIAGNOSIS — G8929 Other chronic pain: Secondary | ICD-10-CM

## 2019-06-30 DIAGNOSIS — M545 Low back pain, unspecified: Secondary | ICD-10-CM | POA: Insufficient documentation

## 2019-06-30 DIAGNOSIS — M5441 Lumbago with sciatica, right side: Secondary | ICD-10-CM | POA: Diagnosis not present

## 2019-06-30 DIAGNOSIS — M25551 Pain in right hip: Secondary | ICD-10-CM

## 2019-06-30 NOTE — Progress Notes (Signed)
Office Visit Note   Patient: Wesley Becker           Date of Birth: 03/11/1956           MRN: [id number] Visit Date: 06/30/2019              Requested by: Lavone Orn, MD [place] [facility] [employer] [address] [place],  [place] [address] PCP: Lavone Orn, MD   Assessment & Plan: Visit Diagnoses:  1. Pain in right hip   2. 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  We will try a course of physical therapy and return in 6 weeks or sooner if there is an exacerbation and consider MRI scan  Follow-Up Instructions: Return if symptoms worsen or fail to improve.   Orders:  Orders Placed This Encounter  Procedures  . XR Lumbar Spine 2-3 Views  . XR HIP UNILAT W OR W/O PELVIS 2-3 VIEWS RIGHT   No orders of the defined types were placed in this encounter.     Procedures: No procedures performed   Clinical Data: No additional findings.   Subjective: Chief Complaint  Patient presents with  . Right Hip - Pain  Patient presents today with right hip pain. He said that he noticed a knot that gets larger and smaller in his lower right back. In the last year he has noticed the top of his foot gets painful after prolonged walking or exercise. In the last 5 months he has pain that radiates down his leg and sometimes all the way to his foot. He takes Advil as needed for pain.  No symptoms referable to the left.  No prior back surgery.  No right groin pain or anterior thigh discomfort  HPI  Review of Systems   Objective: Vital Signs: BP (!) 135/95   Pulse 70   Ht 6' 3.5" (1.918 m)   Wt 217 lb (98.4 kg)   BMI 26.77 kg/m   Physical Exam Constitutional:      Appearance: He is well-developed.  Eyes:     Pupils: Pupils are equal, round, and reactive to light.  Pulmonary:     Effort: Pulmonary effort is normal.  Skin:    General: Skin is warm and dry.  Neurological:     Mental  Status: He is alert and oriented to person, place, and time.  Psychiatric:        Behavior: Behavior normal.     Ortho Exam awake alert and oriented x3.  Comfortable sitting.  Does not limp with ambulation.  Straight leg raise with mild hamstring tightness bilaterally.  Neurologically intact.  Very minimal percussible tenderness on the right paralumbar and sacral region.  Had a very small fibroadipose nodule on the right parasacral region and was nontender.  Specialty Comments:  No specialty comments available.  Imaging: Xr Hip Unilat W Or W/o Pelvis 2-3 Views Right  Result Date: 06/30/2019 AP pelvis and lateral of the right hip were obtained.  There is no evidence of arthritis of either hip.  No acute changes.  No ectopic calcification  Xr Lumbar Spine 2-3 Views  Result Date: 06/30/2019 Films of the lumbar spine obtained in 2 projections.  There is a very mild left lumbar scoliosis predominate along L4 and L5.  There are significant degenerative changes throughout the lumbar spine but mostly at L3-L5.  There are anterior osteophytes and narrowing of the disc space at L3-4 L4-5  and L5-S1.  There is some decrease in the vertebral body height of L5.  No history of recent trauma.  Partial lumbarization of S1    PMFS History: Patient Active Problem List   Diagnosis Date Noted  . Low back pain 06/30/2019  . Precordial chest pain 08/25/2014   Past Medical History:  Diagnosis Date  . Hyperlipidemia   . Sleep apnea    Couldn't use CPAP    Family History  Problem Relation Age of Onset  . Lymphoma Father        dx in his 85s  . Heart disease Brother 68       Died of MI  . Heart disease Maternal Uncle 46  . Breast cancer Paternal Aunt        dx in her 60s  . Heart disease Maternal Grandfather        CAD  . Cancer Paternal Grandmother        gall bladder cancer    Past Surgical History:  Procedure Laterality Date  . HERNIA REPAIR     bilateral inguinal  . PROSTATE BIOPSY    .  REFRACTIVE SURGERY     Social History   Occupational History  . Not on file  Tobacco Use  . Smoking status: Never Smoker  . Smokeless tobacco: Never Used  Substance and Sexual Activity  . Alcohol use: Yes    Comment: rarely  . Drug use: No  . Sexual activity: Not on file

## 2019-06-30 NOTE — Addendum Note (Signed)
Addended by: Lendon Collar on: 06/30/2019 10:38 AM   Modules accepted: Orders

## 2019-07-14 ENCOUNTER — Other Ambulatory Visit: Payer: Self-pay

## 2019-07-14 ENCOUNTER — Ambulatory Visit: Payer: Federal, State, Local not specified - PPO | Attending: Orthopaedic Surgery

## 2019-07-14 DIAGNOSIS — G8929 Other chronic pain: Secondary | ICD-10-CM | POA: Diagnosis not present

## 2019-07-14 DIAGNOSIS — M62830 Muscle spasm of back: Secondary | ICD-10-CM | POA: Insufficient documentation

## 2019-07-14 DIAGNOSIS — M2560 Stiffness of unspecified joint, not elsewhere classified: Secondary | ICD-10-CM | POA: Diagnosis not present

## 2019-07-14 DIAGNOSIS — R293 Abnormal posture: Secondary | ICD-10-CM | POA: Diagnosis not present

## 2019-07-14 DIAGNOSIS — M5441 Lumbago with sciatica, right side: Secondary | ICD-10-CM | POA: Insufficient documentation

## 2019-07-14 NOTE — Therapy (Signed)
[place], [place], [address] Phone: [phone]   Fax:  [phone]  Physical Therapy Evaluation  Patient Details  Name: Wesley Becker MRN: [id number] Date of Birth: [birth year] Referring Provider (PT): Joni Fears, MD   Encounter Date: 07/14/2019  PT End of Session - 07/14/19 1044    Visit Number  1    Number of Visits  12    Date for PT Re-Evaluation  08/27/19    Authorization Type  BCBS    PT Start Time  0955    PT Stop Time  1040    PT Time Calculation (min)  45 min    Activity Tolerance  Patient tolerated treatment well    Behavior During Therapy  [facility] for tasks assessed/performed       Past Medical History:  Diagnosis Date  . Hyperlipidemia   . Sleep apnea    Couldn't use CPAP    Past Surgical History:  Procedure Laterality Date  . HERNIA REPAIR     bilateral inguinal  . PROSTATE BIOPSY    . REFRACTIVE SURGERY      There were no vitals filed for this visit.   Subjective Assessment - 07/14/19 0958    Subjective  He reports RT buttock and latera thigh and occasionally pain to lateral RT ankle. MD wanted some exercises.   May have scan in future.    Limitations  --   3-4 mile walk, extending leg in bed, lye on RT side, limited gym activity from norm   How long can you sit comfortably?  as needed    How long can you stand comfortably?  as needec    How long can you walk comfortably?  as needed    Diagnostic tests  Xray hips negative and mild lower back OA.    Patient Stated Goals  Exercises to correct issues    Currently in Pain?  Yes    Pain Score  3     Pain Location  Hip    Pain Orientation  Right    Pain Descriptors / Indicators  Aching    Pain Type  Chronic pain    Pain Radiating Towards  RT leg lateral    Pain Onset  More than a month ago    Pain Frequency  Intermittent    Aggravating Factors   stretching RT leg out.  lying , long walks    Pain Relieving Factors  change  postion. rest         [facility] PT Assessment - 07/14/19 0001      Assessment   Medical Diagnosis  RT sciatica    Referring Provider (PT)  Joni Fears, MD    Onset Date/Surgical Date  --   6 months ago   Next MD Visit  5-6 weeks    Prior Therapy  No      Precautions   Precautions  None      Restrictions   Weight Bearing Restrictions  No      Balance Screen   Has the patient fallen in the past 6 months  No      Prior Function   Level of Independence  Independent      Cognition   Overall Cognitive Status  Within Functional Limits for tasks assessed      Observation/Other Assessments   Focus on Therapeutic Outcomes (FOTO)   26% limited      Posture/Postural Control   Posture Comments  Lt ilia higher  than RT       ROM / Strength   AROM / PROM / Strength  AROM;PROM;Strength      AROM   AROM Assessment Site  Lumbar    Lumbar Flexion  40    Lumbar Extension  15    Lumbar - Right Side Bend  5    Lumbar - Left Side Bend  5      Strength   Overall Strength Comments  WNL LE                 Objective measurements completed on examination: See above findings.      [facility] Adult PT Treatment/Exercise - 07/14/19 0001      Exercises   Exercises  Lumbar      Lumbar Exercises: Stretches   Lower Trunk Rotation  2 reps;20 seconds    Pelvic Tilt  10 reps;5 seconds    Piriformis Stretch  Right;Left;2 reps;20 seconds    Figure 4 Stretch  2 reps;20 seconds             PT Education - 07/14/19 1043    Education Details  [facility] HEP    Person(s) Educated  Patient    Methods  Explanation;Demonstration;Tactile cues;Verbal cues;Handout    Comprehension  Verbalized understanding;Returned demonstration       PT Short Term Goals - 07/14/19 1049      PT SHORT TERM GOAL #1   Title  He will be indpendent with intial hEp    Time  3    Period  Weeks    Status  New      PT SHORT TERM GOAL #2   Title  He will report pain in leg decr in frequency or intensity with  walking for exercise 4 miles    Time  3    Period  Weeks    Status  New        PT Long Term Goals - 07/14/19 1050      PT LONG TERM GOAL #1   Title  He will be indpendent with all hEP issued    Time  6    Period  Weeks    Status  New      PT LONG TERM GOAL #2   Title  He will report pain does not go past RT hip    Time  6    Period  Weeks    Status  New      PT LONG TERM GOAL #3   Title  He will report that he has no sleep disturbance    Time  6    Period  Weeks    Status  New      PT LONG TERM GOAL #4   Title  He will be able to walk for exercise without increased pain.    Time  6    Period  Weeks    Status  New             Plan - 07/14/19 1044    Clinical Impression Statement  Mr Redwood presentw with rpeort of achey pain into RT lateral leg to foot. Painnis intermittant  but can be aggravated with prolonged sitting and walking.  If he extends leg inlying his pain can also increase. He was some more sore post eval .  Hip did not seem to i=contribute to pain. his lower spine is very stiff.  LE strength normal.   He should improve with skilled PT and HEP  Personal Factors and Comorbidities  Time since onset of injury/illness/exacerbation    Examination-Activity Limitations  Locomotion Level;Carry;Sit    Examination-Participation Restrictions  --   normal acitivut in gym, sleep at times, por;longed sit and stand   Stability/Clinical Decision Making  Stable/Uncomplicated    Rehab Potential  Good    PT Frequency  2x / week    PT Duration  6 weeks    PT Treatment/Interventions  Electrical Stimulation;Iontophoresis 4mg /ml Dexamethasone;Moist Heat;Traction;Therapeutic exercise;Patient/family education;Manual techniques;Passive range of motion;Dry needling    PT Next Visit Plan  [facility] HEP . start core strength, manual for joint stiffness    PT Home Exercise Plan  LTR. PPT , Fig 4 , piriformis stretching    Consulted and Agree with Plan of Care  Patient        Patient will benefit from skilled therapeutic intervention in order to improve the following deficits and impairments:  Pain, Decreased activity tolerance, Increased muscle spasms, Decreased range of motion, Postural dysfunction  Visit Diagnosis: Chronic right-sided low back pain with right-sided sciatica - Plan: PT plan of care cert/re-cert  Abnormal posture - Plan: PT plan of care cert/re-cert  Muscle spasm of back - Plan: PT plan of care cert/re-cert  Joint stiffness of spine - Plan: PT plan of care cert/re-cert     Problem List Patient Active Problem List   Diagnosis Date Noted  . Low back pain 06/30/2019  . Precordial chest pain 08/25/2014    Darrel Hoover PT 07/14/2019, 10:57 AM  [facility] [address] [place], [place], [address] Phone: [phone]   Fax:  [phone]  Name: Gerrel Salmela MRN: [id number] Date of Birth: [birth year]

## 2019-07-14 NOTE — Patient Instructions (Signed)
LTR, piriformis and Fig 4 2-3 reps x 20-30 sec 1-2x/day    PPT x 10 5-10 sec hold  1-2x/day

## 2019-07-16 ENCOUNTER — Other Ambulatory Visit: Payer: Self-pay

## 2019-07-16 ENCOUNTER — Ambulatory Visit: Payer: Federal, State, Local not specified - PPO

## 2019-07-16 DIAGNOSIS — R293 Abnormal posture: Secondary | ICD-10-CM

## 2019-07-16 DIAGNOSIS — G8929 Other chronic pain: Secondary | ICD-10-CM

## 2019-07-16 DIAGNOSIS — M2560 Stiffness of unspecified joint, not elsewhere classified: Secondary | ICD-10-CM

## 2019-07-16 DIAGNOSIS — M5441 Lumbago with sciatica, right side: Secondary | ICD-10-CM | POA: Diagnosis not present

## 2019-07-16 DIAGNOSIS — M62830 Muscle spasm of back: Secondary | ICD-10-CM

## 2019-07-16 NOTE — Therapy (Signed)
[place], [place], [address] Phone: [phone]   Fax:  [phone]  Physical Therapy Treatment  Patient Details  Name: Wesley Becker MRN: [id number] Date of Birth: [birth year] Referring Provider (PT): Joni Fears, MD   Encounter Date: 07/16/2019  PT End of Session - 07/16/19 1122    Visit Number  2    Number of Visits  12    Date for PT Re-Evaluation  08/27/19    Authorization Type  BCBS    PT Start Time  1123    PT Stop Time  1201    PT Time Calculation (min)  38 min    Activity Tolerance  Patient tolerated treatment well    Behavior During Therapy  [facility] for tasks assessed/performed       Past Medical History:  Diagnosis Date  . Hyperlipidemia   . Sleep apnea    Couldn't use CPAP    Past Surgical History:  Procedure Laterality Date  . HERNIA REPAIR     bilateral inguinal  . PROSTATE BIOPSY    . REFRACTIVE SURGERY      There were no vitals filed for this visit.  Subjective Assessment - 07/16/19 1127    Subjective  Some better with the exercises    Currently in Pain?  No/denies                       [facility] Adult PT Treatment/Exercise - 07/16/19 0001      Lumbar Exercises: Stretches   Prone on Elbows Stretch  1 rep;60 seconds    Piriformis Stretch  Right;Left;1 rep;30 seconds    Figure 4 Stretch  30 seconds    Figure 4 Stretch Limitations  RT/LT       Lumbar Exercises: Standing   Other Standing Lumbar Exercises  Band shoulder row and extension and cross body  pulls  RT and LT         Manual Therapy   Manual Therapy  Manual Traction;Joint mobilization    Joint Mobilization  PA cr 3 central and lateral lumbar    Manual Traction  RT leg pulls Gr 4 x 100 pulls             PT Education - 07/16/19 1210    Education Details  HEP    Person(s) Educated  Patient    Methods  Explanation;Demonstration;Verbal cues;Handout    Comprehension  Returned  demonstration;Verbalized understanding       PT Short Term Goals - 07/14/19 1049      PT SHORT TERM GOAL #1   Title  He will be indpendent with intial hEp    Time  3    Period  Weeks    Status  New      PT SHORT TERM GOAL #2   Title  He will report pain in leg decr in frequency or intensity with walking for exercise 4 miles    Time  3    Period  Weeks    Status  New        PT Long Term Goals - 07/14/19 1050      PT LONG TERM GOAL #1   Title  He will be indpendent with all hEP issued    Time  6    Period  Weeks    Status  New      PT LONG TERM GOAL #2   Title  He will report pain does not go past  RT hip    Time  6    Period  Weeks    Status  New      PT LONG TERM GOAL #3   Title  He will report that he has no sleep disturbance    Time  6    Period  Weeks    Status  New      PT LONG TERM GOAL #4   Title  He will be able to walk for exercise without increased pain.    Time  6    Period  Weeks    Status  New            Plan - 07/16/19 1121    Clinical Impression Statement  Improving with stretching.   added band pulls for core stabilization for home . Able to demo all HEP.    PT Treatment/Interventions  Electrical Stimulation;Iontophoresis 4mg /ml Dexamethasone;Moist Heat;Traction;Therapeutic exercise;Patient/family education;Manual techniques;Passive range of motion;Dry needling    PT Next Visit Plan  [facility] HEP . start core strength, manual for joint stiffness    PT Home Exercise Plan  LTR. PPT , Fig 4 , piriformis stretching,, cross chest pulls and shoulder rows and extensions    Consulted and Agree with Plan of Care  Patient       Patient will benefit from skilled therapeutic intervention in order to improve the following deficits and impairments:  Pain, Decreased activity tolerance, Increased muscle spasms, Decreased range of motion, Postural dysfunction  Visit Diagnosis: Chronic right-sided low back pain with right-sided sciatica  Abnormal  posture  Muscle spasm of back  Joint stiffness of spine     Problem List Patient Active Problem List   Diagnosis Date Noted  . Low back pain 06/30/2019  . Precordial chest pain 08/25/2014    Darrel Hoover  PT 07/16/2019, 12:11 PM  [facility] [address] [place], [place], [address] Phone: [phone]   Fax:  [phone]  Name: Wesley Becker MRN: [id number] Date of Birth: Nov 11, 1955

## 2019-07-16 NOTE — Patient Instructions (Signed)
Band pulls row, shoulder ext, cross body pulls  X 15-20 reps daily   RT/LT or bilaterally

## 2019-07-28 ENCOUNTER — Ambulatory Visit: Payer: Federal, State, Local not specified - PPO

## 2019-07-28 ENCOUNTER — Other Ambulatory Visit: Payer: Self-pay

## 2019-07-28 DIAGNOSIS — G8929 Other chronic pain: Secondary | ICD-10-CM

## 2019-07-28 DIAGNOSIS — M62830 Muscle spasm of back: Secondary | ICD-10-CM | POA: Diagnosis not present

## 2019-07-28 DIAGNOSIS — R293 Abnormal posture: Secondary | ICD-10-CM | POA: Diagnosis not present

## 2019-07-28 DIAGNOSIS — M2560 Stiffness of unspecified joint, not elsewhere classified: Secondary | ICD-10-CM | POA: Diagnosis not present

## 2019-07-28 DIAGNOSIS — M5441 Lumbago with sciatica, right side: Secondary | ICD-10-CM | POA: Diagnosis not present

## 2019-07-28 NOTE — Patient Instructions (Signed)
RT hip IR strength red band 2x10 reps daily,  LTR and Knee to chest stretch and Prone RT hip IR stretch 3-10 reps 5 -30 sec hold

## 2019-07-28 NOTE — Therapy (Addendum)
[place], [place], [address] Phone: [phone]   Fax:  [phone]  Physical Therapy Treatment  Patient Details  Name: Wesley Becker MRN: [id number] Date of Birth: 1956-09-19 Referring Provider (PT): Joni Fears, MD   Encounter Date: 07/28/2019  PT End of Session - 07/28/19 1000    Visit Number  3    Number of Visits  12    Date for PT Re-Evaluation  08/27/19    Authorization Type  BCBS    PT Start Time  0915    PT Stop Time  1000    PT Time Calculation (min)  45 min    Activity Tolerance  Patient tolerated treatment well    Behavior During Therapy  [facility] for tasks assessed/performed       Past Medical History:  Diagnosis Date  . Hyperlipidemia   . Sleep apnea    Couldn't use CPAP    Past Surgical History:  Procedure Laterality Date  . HERNIA REPAIR     bilateral inguinal  . PROSTATE BIOPSY    . REFRACTIVE SURGERY      There were no vitals filed for this visit.  Subjective Assessment - 07/28/19 [id number]    Subjective  Doing Ok . Did nt do the stretching much due to vacation.  LAteral thigh to hip pain now.  Did alot of yard work and walking without increased pain   Able to stretch leg in bed without increased pain.   Lying RT side causes some pain    Pain Score  2     Pain Location  Hip    Pain Orientation  Right    Pain Descriptors / Indicators  Aching    Pain Type  Chronic pain    Pain Radiating Towards  RTR lateral thigh    Pain Onset  More than a month ago    Pain Frequency  Intermittent    Aggravating Factors   long walks                       [facility] Adult PT Treatment/Exercise - 07/28/19 0001      Lumbar Exercises: Stretches   Double Knee to Chest Stretch  3 reps;20 seconds    Lower Trunk Rotation  3 reps;20 seconds    Prone on Elbows Stretch  1 rep;60 seconds      Lumbar Exercises: Seated   Other Seated Lumbar Exercises  RT hip ER red band x 20 reps      Manual Therapy   Manual Therapy  Muscle Energy Technique;Passive ROM    Joint Mobilization  PA cr 3 central and lateral lumbar    Passive ROM  RT hip IR and STW with gentle hip rotaiton in prone.     Muscle Energy Technique  hip ext with hip flexion 10 sec x 4       Ionto patch 1cc 4-6 hr  RT SI area dexamethasone. Precautions  Instructed to pt.      PT Education - 07/28/19 0958    Education Details  HEP    Person(s) Educated  Patient    Methods  Explanation;Demonstration;Tactile cues;Verbal cues;Handout    Comprehension  Returned demonstration;Verbalized understanding       PT Short Term Goals - 07/28/19 1001      PT SHORT TERM GOAL #1   Title  He will be indpendent with intial hEp    Status  Achieved  PT SHORT TERM GOAL #2   Title  He will report pain in leg decr in frequency or intensity with walking for exercise 4 miles    Status  Achieved        PT Long Term Goals - 07/14/19 1050      PT LONG TERM GOAL #1   Title  He will be indpendent with all hEP issued    Time  6    Period  Weeks    Status  New      PT LONG TERM GOAL #2   Title  He will report pain does not go past RT hip    Time  6    Period  Weeks    Status  New      PT LONG TERM GOAL #3   Title  He will report that he has no sleep disturbance    Time  6    Period  Weeks    Status  New      PT LONG TERM GOAL #4   Title  He will be able to walk for exercise without increased pain.    Time  6    Period  Weeks    Status  New            Plan - 07/28/19 1000    Clinical Impression Statement  Over all improved with pain more proximal and now can stretch leg without pain.    PT Treatment/Interventions  Electrical Stimulation;Iontophoresis 4mg /ml Dexamethasone;Moist Heat;Traction;Therapeutic exercise;Patient/family education;Manual techniques;Passive range of motion;Dry needling    PT Home Exercise Plan  LTR. PPT , Fig 4 , piriformis stretching,, cross chest pulls and shoulder rows and  extensions    Consulted and Agree with Plan of Care  Patient       Patient will benefit from skilled therapeutic intervention in order to improve the following deficits and impairments:  Pain, Decreased activity tolerance, Increased muscle spasms, Decreased range of motion, Postural dysfunction  Visit Diagnosis: Chronic right-sided low back pain with right-sided sciatica  Abnormal posture  Muscle spasm of back  Joint stiffness of spine     Problem List Patient Active Problem List   Diagnosis Date Noted  . Low back pain 06/30/2019  . Precordial chest pain 08/25/2014    Darrel Hoover PT 07/28/2019, 10:08 AM  [facility] [address] [place], [place], [address] Phone: [phone]   Fax:  [phone]  Name: Wesley Becker MRN: [id number] Date of Birth: 05/29/1956

## 2019-07-30 ENCOUNTER — Ambulatory Visit: Payer: Federal, State, Local not specified - PPO

## 2019-07-30 ENCOUNTER — Other Ambulatory Visit: Payer: Self-pay

## 2019-07-30 DIAGNOSIS — M2560 Stiffness of unspecified joint, not elsewhere classified: Secondary | ICD-10-CM | POA: Diagnosis not present

## 2019-07-30 DIAGNOSIS — R293 Abnormal posture: Secondary | ICD-10-CM

## 2019-07-30 DIAGNOSIS — G8929 Other chronic pain: Secondary | ICD-10-CM

## 2019-07-30 DIAGNOSIS — M62830 Muscle spasm of back: Secondary | ICD-10-CM | POA: Diagnosis not present

## 2019-07-30 DIAGNOSIS — M5441 Lumbago with sciatica, right side: Secondary | ICD-10-CM | POA: Diagnosis not present

## 2019-07-30 NOTE — Therapy (Signed)
[place], [place], [address] Phone: [phone]   Fax:  [phone]  Physical Therapy Treatment  Patient Details  Name: Wesley Becker MRN: [id number] Date of Birth: 10-22-1955 Referring Provider (PT): Joni Fears, MD   Encounter Date: 07/30/2019  PT End of Session - 07/30/19 0914    Visit Number  4    Number of Visits  12    Date for PT Re-Evaluation  08/27/19    Authorization Type  BCBS    PT Start Time  0915    PT Stop Time  0955    PT Time Calculation (min)  40 min    Activity Tolerance  Patient tolerated treatment well    Behavior During Therapy  [facility] for tasks assessed/performed       Past Medical History:  Diagnosis Date  . Hyperlipidemia   . Sleep apnea    Couldn't use CPAP    Past Surgical History:  Procedure Laterality Date  . HERNIA REPAIR     bilateral inguinal  . PROSTATE BIOPSY    . REFRACTIVE SURGERY      There were no vitals filed for this visit.  Subjective Assessment - 07/30/19 0915    Subjective  Back some better.    Currently in Pain?  Yes    Pain Score  2     Pain Location  Hip    Pain Orientation  Right    Pain Descriptors / Indicators  Aching    Pain Type  Chronic pain    Pain Radiating Towards  lateral RT thigh    Pain Onset  More than a month ago    Pain Frequency  Intermittent                       [facility] Adult PT Treatment/Exercise - 07/30/19 0001      Lumbar Exercises: Stretches   Double Knee to Chest Stretch  2 reps;30 seconds    Lower Trunk Rotation  3 reps;20 seconds    Lower Trunk Rotation Limitations  knee over knee    Piriformis Stretch  Right;2 reps;30 seconds    Figure 4 Stretch  1 rep;30 seconds    Figure 4 Stretch Limitations  RT/LT       Lumbar Exercises: Standing   Other Standing Lumbar Exercises  kettle bell lift 10# x 15 reps cued for technique dead lift.       Lumbar Exercises: Seated   Other Seated Lumbar  Exercises  RT hip ER green band x 20 reps      Lumbar Exercises: Sidelying   Clam  Right;Left;10 reps    Clam Limitations  green band    Hip Abduction  Right;Left;10 reps    Hip Abduction Limitations  green band      Modalities   Modalities  Iontophoresis      Iontophoresis   Type of Iontophoresis  Dexamethasone    Location  RT S area    Dose  1cc    Time  4-6 hours      Manual Therapy   Joint Mobilization  PA    Gr 4 central and lateral lumbar posterior  glides RT hip       Passive ROM  RT hip IR and STW with gentle hip rotaiton in prone. piriformis  RT    SLR with adduction  RT ,     Manual Traction  RT leg pulls Gr 4 x 100 pulls  PT Short Term Goals - 07/28/19 1001      PT SHORT TERM GOAL #1   Title  He will be indpendent with intial hEp    Status  Achieved      PT SHORT TERM GOAL #2   Title  He will report pain in leg decr in frequency or intensity with walking for exercise 4 miles    Status  Achieved        PT Long Term Goals - 07/14/19 1050      PT LONG TERM GOAL #1   Title  He will be indpendent with all hEP issued    Time  6    Period  Weeks    Status  New      PT LONG TERM GOAL #2   Title  He will report pain does not go past RT hip    Time  6    Period  Weeks    Status  New      PT LONG TERM GOAL #3   Title  He will report that he has no sleep disturbance    Time  6    Period  Weeks    Status  New      PT LONG TERM GOAL #4   Title  He will be able to walk for exercise without increased pain.    Time  6    Period  Weeks    Status  New            Plan - 07/30/19 0914    Clinical Impression Statement  Improved but RT SIarea still stiff and sore.  agreed to new exercises. no increased pain    PT Treatment/Interventions  Electrical Stimulation;Iontophoresis 4mg /ml Dexamethasone;Moist Heat;Traction;Therapeutic exercise;Patient/family education;Manual techniques;Passive range of motion;Dry needling    PT Next Visit Plan   REview HEP . start core strength, manual for joint stiffness    PT Home Exercise Plan  LTR. PPT , Fig 4 , piriformis stretching,, cross chest pulls and shoulder rows and extensions    Consulted and Agree with Plan of Care  Patient       Patient will benefit from skilled therapeutic intervention in order to improve the following deficits and impairments:  Pain, Decreased activity tolerance, Increased muscle spasms, Decreased range of motion, Postural dysfunction  Visit Diagnosis: Chronic right-sided low back pain with right-sided sciatica  Abnormal posture  Muscle spasm of back  Joint stiffness of spine     Problem List Patient Active Problem List   Diagnosis Date Noted  . Low back pain 06/30/2019  . Precordial chest pain 08/25/2014    Darrel Hoover  PT 07/30/2019, 9:58 AM  [facility] [address] [place], [place], [address] Phone: [phone]   Fax:  [phone]  Name: Wesley Becker MRN: [id number] Date of Birth: [birth year]

## 2019-08-04 ENCOUNTER — Ambulatory Visit: Payer: Federal, State, Local not specified - PPO

## 2019-08-04 ENCOUNTER — Other Ambulatory Visit: Payer: Self-pay

## 2019-08-04 DIAGNOSIS — M5441 Lumbago with sciatica, right side: Secondary | ICD-10-CM | POA: Diagnosis not present

## 2019-08-04 DIAGNOSIS — M2560 Stiffness of unspecified joint, not elsewhere classified: Secondary | ICD-10-CM | POA: Diagnosis not present

## 2019-08-04 DIAGNOSIS — G8929 Other chronic pain: Secondary | ICD-10-CM

## 2019-08-04 DIAGNOSIS — M62830 Muscle spasm of back: Secondary | ICD-10-CM | POA: Diagnosis not present

## 2019-08-04 DIAGNOSIS — R293 Abnormal posture: Secondary | ICD-10-CM

## 2019-08-04 NOTE — Therapy (Addendum)
[place], [place], [address] Phone: [phone]   Fax:  [phone]  Physical Therapy Treatment/Discharge  Patient Details  Name: Wesley Becker MRN: [id number] Date of Birth: 12-27-1955 Referring Provider (PT): Wesley Fears, MD   Encounter Date: 08/04/2019  PT End of Session - 08/04/19 0912    Visit Number  5    Number of Visits  12    Date for PT Re-Evaluation  08/27/19    Authorization Type  BCBS    PT Start Time  0915    PT Stop Time  1000    PT Time Calculation (min)  45 min    Activity Tolerance  Patient tolerated treatment well    Behavior During Therapy  Wesley Becker for tasks assessed/performed       Past Medical History:  Diagnosis Date  . Hyperlipidemia   . Sleep apnea    Couldn't use CPAP    Past Surgical History:  Procedure Laterality Date  . HERNIA REPAIR     bilateral inguinal  . PROSTATE BIOPSY    . REFRACTIVE SURGERY      There were no vitals filed for this visit.  Subjective Assessment - 08/04/19 1003    Subjective  RT back a little sore still.    Pain Score  2     Pain Location  Hip   SI area   Pain Orientation  Right    Pain Descriptors / Indicators  Aching    Pain Type  Chronic pain    Pain Onset  More than a month ago    Pain Frequency  Intermittent    Aggravating Factors   long walk                       [facility] Adult PT Treatment/Exercise - 08/04/19 0001      Lumbar Exercises: Stretches   Single Knee to Chest Stretch  Right;Left;5 reps    Double Knee to Chest Stretch  2 reps;30 seconds    Lower Trunk Rotation  2 reps;30 seconds    Lower Trunk Rotation Limitations  knee over knee      Lumbar Exercises: Seated   Other Seated Lumbar Exercises  RT hip ER green band x 20 reps      Lumbar Exercises: Sidelying   Clam  Right;20 reps    Clam Limitations  green band    Hip Abduction  20 reps    Hip Abduction Limitations  green band      Modalities    Modalities  Ultrasound      Ultrasound   Ultrasound Location  RT SI lower lumbar and gluteasl    Ultrasound Parameters  100% 1.6 Wcm2  100%     Ultrasound Goals  Pain      Iontophoresis   Type of Iontophoresis  Dexamethasone    Location  RT S area    Dose  1cc    Time  4-6 hours      Manual Therapy   Joint Mobilization  PA    Gr 4 central and lateral lumbar posterior  glides RT hip       Passive ROM  RT hip IR and STW with gentle hip rotaiton in prone. piriformis  RT    SLR with adduction  RT ,     Manual Traction  RT leg pulls Gr 4 x 100 pulls  PT Short Term Goals - 07/28/19 1001      PT SHORT TERM GOAL #1   Title  He will be indpendent with intial hEp    Status  Achieved      PT SHORT TERM GOAL #2   Title  He will report pain in leg decr in frequency or intensity with walking for exercise 4 miles    Status  Achieved        PT Long Term Goals - 07/14/19 1050      PT LONG TERM GOAL #1   Title  He will be indpendent with all hEP issued    Time  6    Period  Weeks    Status  New      PT LONG TERM GOAL #2   Title  He will report pain does not go past RT hip    Time  6    Period  Weeks    Status  New      PT LONG TERM GOAL #3   Title  He will report that he has no sleep disturbance    Time  6    Period  Weeks    Status  New      PT LONG TERM GOAL #4   Title  He will be able to walk for exercise without increased pain.    Time  6    Period  Weeks    Status  New            Plan - 08/04/19 0913    Clinical Impression Statement  improved but still sore and reports getting some mild doreness on LT in same area.  will see 4-5 more session then discharge    PT Treatment/Interventions  Electrical Stimulation;Iontophoresis 17m/ml Dexamethasone;Moist Heat;Traction;Therapeutic exercise;Patient/family education;Manual techniques;Passive range of motion;Dry needling    PT Next Visit Plan  . start core strength, manual for joint stiffness     PT Home Exercise Plan  LTR. PPT , Fig 4 , piriformis stretching,, cross chest pulls and shoulder rows and extensions    Consulted and Agree with Plan of Care  Patient       Patient will benefit from skilled therapeutic intervention in order to improve the following deficits and impairments:  Pain, Decreased activity tolerance, Increased muscle spasms, Decreased range of motion, Postural dysfunction  Visit Diagnosis: Chronic right-sided low back pain with right-sided sciatica  Abnormal posture  Muscle spasm of back  Joint stiffness of spine     Problem List Patient Active Problem List   Diagnosis Date Noted  . Low back pain 06/30/2019  . Precordial chest pain 08/25/2014    CDarrel HooverPT 08/04/2019, 10:09 AM  [facility]GY[place] [address]Phone: [phone]  Fax:  [phone] Name: PFrandy BasnettMRN: [id number] of Birth: 102/22/1957 PHYSICAL THERAPY DISCHARGE SUMMARY  Visits from Start of Care: 5  Current functional level related to goals / functional outcomes: See above . He returned to MD and has had an MRI.  Will need new order to return. Would be glad to see him if needed  Remaining deficits: Unknown . See above   Education / Equipment: HEP Plan:  Patient goals were partially met. Patient is being discharged due to not returning since the last visit.  ?????  Wesley Becker  PT   09/10/19

## 2019-08-06 ENCOUNTER — Ambulatory Visit: Payer: Federal, State, Local not specified - PPO

## 2019-08-11 ENCOUNTER — Ambulatory Visit: Payer: Federal, State, Local not specified - PPO

## 2019-08-12 ENCOUNTER — Encounter: Payer: Self-pay | Admitting: Orthopaedic Surgery

## 2019-08-13 ENCOUNTER — Ambulatory Visit: Payer: Federal, State, Local not specified - PPO

## 2019-08-13 ENCOUNTER — Other Ambulatory Visit: Payer: Self-pay | Admitting: Orthopaedic Surgery

## 2019-08-13 DIAGNOSIS — G8929 Other chronic pain: Secondary | ICD-10-CM

## 2019-08-13 DIAGNOSIS — M5441 Lumbago with sciatica, right side: Secondary | ICD-10-CM

## 2019-08-13 NOTE — Telephone Encounter (Signed)
PLEASE SCHEDULE MRI LS SPINE TO R/O HNP.

## 2019-08-18 ENCOUNTER — Ambulatory Visit: Payer: Federal, State, Local not specified - PPO

## 2019-08-20 ENCOUNTER — Ambulatory Visit: Payer: Federal, State, Local not specified - PPO

## 2019-09-09 ENCOUNTER — Ambulatory Visit
Admission: RE | Admit: 2019-09-09 | Discharge: 2019-09-09 | Disposition: A | Discharge status: Home / Self Care | Payer: Federal, State, Local not specified - PPO | Source: Ambulatory Visit | Attending: Orthopaedic Surgery | Admitting: Orthopaedic Surgery

## 2019-09-09 ENCOUNTER — Other Ambulatory Visit: Payer: Self-pay

## 2019-09-09 DIAGNOSIS — M48061 Spinal stenosis, lumbar region without neurogenic claudication: Secondary | ICD-10-CM | POA: Diagnosis not present

## 2019-09-09 DIAGNOSIS — M5441 Lumbago with sciatica, right side: Secondary | ICD-10-CM

## 2019-09-09 DIAGNOSIS — G8929 Other chronic pain: Secondary | ICD-10-CM

## 2019-09-17 ENCOUNTER — Encounter: Payer: Self-pay | Admitting: Orthopaedic Surgery

## 2019-09-17 ENCOUNTER — Other Ambulatory Visit: Payer: Self-pay

## 2019-09-17 ENCOUNTER — Ambulatory Visit: Payer: Federal, State, Local not specified - PPO | Admitting: Orthopaedic Surgery

## 2019-09-17 VITALS — Ht 75.5 in | Wt 217.0 lb

## 2019-09-17 DIAGNOSIS — M5441 Lumbago with sciatica, right side: Secondary | ICD-10-CM

## 2019-09-17 DIAGNOSIS — G8929 Other chronic pain: Secondary | ICD-10-CM | POA: Diagnosis not present

## 2019-09-17 NOTE — Progress Notes (Signed)
Office Visit Note   Patient: Wesley Becker           Date of Birth: Feb 26, 1956           MRN: [id number] Visit Date: 09/17/2019              Requested by: Lavone Orn, MD [place] [address] [employer] [address] [place],   [id number] PCP: Lavone Orn, MD   Assessment & Plan: Visit Diagnoses:  1. 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  He has been through a course of physical therapy and will take some over-the-counter medicines.  Long discussion over 20 minutes or longer regarding this diagnosis of what he may expect over time.  No neurologic defects.  Can consider epidural steroid injection.  We will continue with his exercises.  Given a copy of the scan which we reviewed in detail  Follow-Up Instructions: Return if symptoms worsen or fail to improve.   Orders:  No orders of the defined types were placed in this encounter.  No orders of the defined types were placed in this encounter.     Procedures: No procedures performed   Clinical Data: No additional findings.   Subjective: Chief Complaint  Patient presents with  . Lower Back - Follow-up    L-Spine MRI results  Patient presents today for follow up on his lower back. He had an MRI on 09/09/2019 and is here today for those results.  Has been through a course of physical therapy and has a home exercise program.  Has occasional pain in his back and into his right lower extremity involving both the right thigh and right leg with no bowel or bladder dysfunction.  No residual numbness tingling or weakness  HPI  Review of Systems  Constitutional: Negative for fatigue.  HENT: Negative for ear pain.   Eyes: Negative for pain.  Respiratory: Negative for shortness of breath.    Cardiovascular: Negative for leg swelling.  Gastrointestinal: Negative for constipation and diarrhea.  Endocrine: Negative for cold intolerance and heat intolerance.  Genitourinary: Negative for difficulty urinating.  Musculoskeletal: Negative for joint swelling.  Skin: Negative for rash.  Allergic/Immunologic: Negative for food allergies.  Neurological: Negative for weakness.  Hematological: Does not bruise/bleed easily.  Psychiatric/Behavioral: Negative for sleep disturbance.     Objective: Vital Signs: Ht 6' 3.5" (1.918 m)   Wt 217 lb (98.4 kg)   BMI 26.77 kg/m   Physical Exam Constitutional:      Appearance: He is well-developed.  Eyes:     Pupils: Pupils are equal, round, and reactive to light.  Pulmonary:     Effort: Pulmonary effort is normal.  Skin:    General: Skin is warm and dry.  Neurological:     Mental Status: He is alert and oriented to person, place, and time.  Psychiatric:        Behavior: Behavior normal.     Ortho Exam awake alert and oriented x3.  Comfortable sitting.  Walks without a limp or in no distress straight leg raise negative.  Motor and sensory exam intact both lower extremities.  Painless range of motion both hips and knees  Specialty Comments:  No specialty comments available.  Imaging: No results found.   PMFS History: Patient Active  Problem List   Diagnosis Date Noted  . Low back pain 06/30/2019  . Precordial chest pain 08/25/2014   Past Medical History:  Diagnosis Date  . Hyperlipidemia   . Sleep apnea    Couldn't use CPAP    Family History  Problem Relation Age of Onset  . Lymphoma Father        dx in his 68s  . Heart disease Brother 64       Died of MI  . Heart disease Maternal Uncle 46  . Breast cancer Paternal Aunt        dx in her 67s  . Heart disease Maternal Grandfather        CAD  . Cancer Paternal Grandmother        gall bladder cancer    Past Surgical History:  Procedure Laterality Date  . HERNIA  REPAIR     bilateral inguinal  . PROSTATE BIOPSY    . REFRACTIVE SURGERY     Social History   Occupational History  . Not on file  Tobacco Use  . Smoking status: Never Smoker  . Smokeless tobacco: Never Used  Substance and Sexual Activity  . Alcohol use: Yes    Comment: rarely  . Drug use: No  . Sexual activity: Not on file

## 2019-10-15 ENCOUNTER — Encounter: Payer: Self-pay | Admitting: Internal Medicine

## 2019-10-15 ENCOUNTER — Telehealth: Payer: Self-pay | Admitting: Internal Medicine

## 2019-10-15 DIAGNOSIS — Z800 Family history of malignant neoplasm of digestive organs: Secondary | ICD-10-CM | POA: Insufficient documentation

## 2019-10-15 NOTE — Telephone Encounter (Signed)
Good afternoon, Dr. Carlean Purl, this patient had his GI records from [facility] transferred to [facility] because his GI MD has retired.  He requested that you review his records because he is acquainted with you.  Records will be sent to your office for review.

## 2019-10-15 NOTE — Telephone Encounter (Signed)
OK for direct colonoscopy - FHx colon cancer

## 2019-11-17 ENCOUNTER — Ambulatory Visit (AMBULATORY_SURGERY_CENTER): Payer: Self-pay

## 2019-11-17 ENCOUNTER — Other Ambulatory Visit: Payer: Self-pay

## 2019-11-17 VITALS — Temp 96.0°F | Ht 75.5 in | Wt 222.6 lb

## 2019-11-17 DIAGNOSIS — Z01818 Encounter for other preprocedural examination: Secondary | ICD-10-CM

## 2019-11-17 DIAGNOSIS — Z1211 Encounter for screening for malignant neoplasm of colon: Secondary | ICD-10-CM

## 2019-11-17 NOTE — Progress Notes (Signed)
No egg or soy allergy known to patient  No issues with past sedation with any surgeries  or procedures, no intubation problems  No diet pills per patient No home 02 use per patient  No blood thinners per patient  Pt denies issues with constipation  No A fib or A flutter  EMMI video sent to pt's e mail    Pt states they had been told their father had colon cancer when he passed away, however, now he has found out his father actually had Hodgkins Lymphoma.  Due to the COVID-19 pandemic we are asking patients to follow these guidelines. Please only bring one care partner. Please be aware that your care partner may wait in the car in the parking lot or if they feel like they will be too hot to wait in the car, they may wait in the lobby on the 4th floor. All care partners are required to wear a mask the entire time (we do not have any that we can provide them), they need to practice social distancing, and we will do a Covid check for all patient's and care partners when you arrive. Also we will check their temperature and your temperature. If the care partner waits in their car they need to stay in the parking lot the entire time and we will call them on their cell phone when the patient is ready for discharge so they can bring the car to the front of the building. Also all patient's will need to wear a mask into building.

## 2019-11-25 ENCOUNTER — Other Ambulatory Visit: Payer: Self-pay | Admitting: Internal Medicine

## 2019-11-25 DIAGNOSIS — Z1159 Encounter for screening for other viral diseases: Secondary | ICD-10-CM | POA: Diagnosis not present

## 2019-11-26 LAB — SARS CORONAVIRUS 2 (TAT 6-24 HRS): SARS Coronavirus 2: NEGATIVE

## 2019-12-01 ENCOUNTER — Other Ambulatory Visit: Payer: Self-pay

## 2019-12-01 ENCOUNTER — Ambulatory Visit (AMBULATORY_SURGERY_CENTER): Payer: Federal, State, Local not specified - PPO | Admitting: Internal Medicine

## 2019-12-01 ENCOUNTER — Encounter: Payer: Self-pay | Admitting: Internal Medicine

## 2019-12-01 VITALS — BP 116/81 | HR 67 | Temp 96.9°F | Resp 15 | Ht 75.5 in | Wt 222.6 lb

## 2019-12-01 DIAGNOSIS — D125 Benign neoplasm of sigmoid colon: Secondary | ICD-10-CM | POA: Diagnosis not present

## 2019-12-01 DIAGNOSIS — K635 Polyp of colon: Secondary | ICD-10-CM

## 2019-12-01 DIAGNOSIS — Z1211 Encounter for screening for malignant neoplasm of colon: Secondary | ICD-10-CM | POA: Diagnosis not present

## 2019-12-01 MED ORDER — SODIUM CHLORIDE 0.9 % IV SOLN: 500.0000 mL | Freq: Once | INTRAVENOUS | Status: DC

## 2019-12-01 NOTE — Progress Notes (Signed)
Pt's states no medical or surgical changes since previsit or office visit.  Temp JB VS DT  

## 2019-12-01 NOTE — Op Note (Signed)
[facility] Patient Name: Wesley Becker Procedure Date: 12/01/2019 8:05 AM MRN: [id number] Endoscopist: Gatha Mayer , MD Age: 64 Referring MD:  Date of Birth: September 16, 1956 Gender: Male Account #: [id number] Procedure:                Colonoscopy Indications:              Screening for colorectal malignant neoplasm, Last                            colonoscopy: 2011 Medicines:                Propofol per Anesthesia Procedure:                Pre-Anesthesia Assessment:                           - Prior to the procedure, a History and Physical                            was performed, and patient medications and                            allergies were reviewed. The patient's tolerance of                            previous anesthesia was also reviewed. The risks                            and benefits of the procedure and the sedation                            options and risks were discussed with the patient.                            All questions were answered, and informed consent                            was obtained. Prior Anticoagulants: The patient has                            taken no previous anticoagulant or antiplatelet                            agents. ASA Grade Assessment: II - A patient with                            mild systemic disease. After reviewing the risks                            and benefits, the patient was deemed in                            satisfactory condition to undergo the procedure.  After obtaining informed consent, the colonoscope                            was passed under direct vision. Throughout the                            procedure, the patient's blood pressure, pulse, and                            oxygen saturations were monitored continuously. The                            Colonoscope was introduced through the anus and                            advanced to the the cecum, identified by                       appendiceal orifice and ileocecal valve. The                            colonoscopy was performed without difficulty. The                            patient tolerated the procedure well. The quality                            of the bowel preparation was good. The bowel                            preparation used was Miralax via split dose                            instruction. The ileocecal valve, appendiceal                            orifice, and rectum were photographed. Scope In: 8:24:44 AM Scope Out: 8:39:12 AM Scope Withdrawal Time: 0 hours 11 minutes 45 seconds  Total Procedure Duration: 0 hours 14 minutes 28 seconds  Findings:                 The perianal and digital rectal examinations were                            normal. Pertinent negatives include normal prostate                            (size, shape, and consistency).                           A 8 to 10 mm polyp was found in the sigmoid colon.                            The polyp was pedunculated. The polyp was removed  with a hot snare. Resection and retrieval were                            complete. Verification of patient identification                            for the specimen was done. Estimated blood loss:                            none.                           The exam was otherwise without abnormality on                            direct and retroflexion views. Complications:            No immediate complications. Estimated Blood Loss:     Estimated blood loss: none. Impression:               - One 8 to 10 mm polyp in the sigmoid colon,                            removed with a hot snare. Resected and retrieved.                           - The examination was otherwise normal on direct                            and retroflexion views. Recommendation:           - Patient has a contact number available for                            emergencies. The signs and  symptoms of potential                            delayed complications were discussed with the                            patient. Return to normal activities tomorrow.                            Written discharge instructions were provided to the                            patient.                           - Resume previous diet.                           - Continue present medications.                           - Repeat colonoscopy is recommended. The  colonoscopy date will be determined after pathology                            results from today's exam become available for                            review.                           - No aspirin, ibuprofen, naproxen, or other                            non-steroidal anti-inflammatory drugs for 2 weeks                            after polyp removal. Gatha Mayer, MD 12/01/2019 8:45:23 AM This report has been signed electronically.

## 2019-12-01 NOTE — Patient Instructions (Addendum)
I found and removed one small polyp.  Looks benign, might be pre-cancerous - but it is removed.  All else ok.  I will let you know pathology results and when to have another routine colonoscopy by mail and/or My Chart.  I appreciate the opportunity to care for you. Gatha Mayer, MD, [facility]  Polyp handout given to patient.  Resume previous diet. Continue present medications.  No aspirin, ibuprofen, naproxen, or other NSAID drugs for 2 weeks.  YOU HAD AN ENDOSCOPIC PROCEDURE TODAY AT [facility]:   Refer to the procedure report that was given to you for any specific questions about what was found during the examination.  If the procedure report does not answer your questions, please call your gastroenterologist to clarify.  If you requested that your care partner not be given the details of your procedure findings, then the procedure report has been included in a sealed envelope for you to review at your convenience later.  YOU SHOULD EXPECT: Some feelings of bloating in the abdomen. Passage of more gas than usual.  Walking can help get rid of the air that was put into your GI tract during the procedure and reduce the bloating. If you had a lower endoscopy (such as a colonoscopy or flexible sigmoidoscopy) you may notice spotting of blood in your stool or on the toilet paper. If you underwent a bowel prep for your procedure, you may not have a normal bowel movement for a few days.  Please Note:  You might notice some irritation and congestion in your nose or some drainage.  This is from the oxygen used during your procedure.  There is no need for concern and it should clear up in a day or so.  SYMPTOMS TO REPORT IMMEDIATELY:   Following lower endoscopy (colonoscopy or flexible sigmoidoscopy):  Excessive amounts of blood in the stool  Significant tenderness or worsening of abdominal pains  Swelling of the abdomen that is new, acute  Fever of 100F or higher For urgent or  emergent issues, a gastroenterologist can be reached at any hour by calling [phone].   DIET:  We do recommend a small meal at first, but then you may proceed to your regular diet.  Drink plenty of fluids but you should avoid alcoholic beverages for 24 hours.  ACTIVITY:  You should plan to take it easy for the rest of today and you should NOT DRIVE or use heavy machinery until tomorrow (because of the sedation medicines used during the test).    FOLLOW UP: Our staff will call the number listed on your records 48-72 hours following your procedure to check on you and address any questions or concerns that you may have regarding the information given to you following your procedure. If we do not reach you, we will leave a message.  We will attempt to reach you two times.  During this call, we will ask if you have developed any symptoms of COVID 19. If you develop any symptoms (ie: fever, flu-like symptoms, shortness of breath, cough etc.) before then, please call [phone].  If you test positive for Covid 19 in the 2 weeks post procedure, please call and report this information to [place].    If any biopsies were taken you will be contacted by phone or by letter within the next 1-3 weeks.  Please call us at [phone] if you have not heard about the biopsies in 3 weeks.    SIGNATURES/CONFIDENTIALITY: You and/or your  care partner have signed paperwork which will be entered into your electronic medical record.  These signatures attest to the fact that that the information above on your After Visit Summary has been reviewed and is understood.  Full responsibility of the confidentiality of this discharge information lies with you and/or your care-partner.

## 2019-12-01 NOTE — Progress Notes (Signed)
Called to room to assist during endoscopic procedure.  Patient ID and intended procedure confirmed with present staff. Received instructions for my participation in the procedure from the performing physician.  

## 2019-12-01 NOTE — Progress Notes (Signed)
Pt tolerated well. VSS. Awake and to recovery. 

## 2019-12-03 ENCOUNTER — Telehealth: Payer: Self-pay

## 2019-12-03 ENCOUNTER — Encounter: Payer: Self-pay | Admitting: Internal Medicine

## 2019-12-03 NOTE — Telephone Encounter (Signed)
  Follow up Call-  Call back number 12/01/2019  Post procedure Call Back phone  # [phone]  Permission to leave phone message Yes  Some recent data might be hidden     Patient questions:  Do you have a fever, pain , or abdominal swelling? No. Pain Score  0 *  Have you tolerated food without any problems? Yes.    Have you been able to return to your normal activities? Yes.    Do you have any questions about your discharge instructions: Diet   No. Medications  No. Follow up visit  No.  Do you have questions or concerns about your Care? No.  Actions: * If pain score is 4 or above:Have you developed a fever since your procedure? no  2.   Have you had an respiratory symptoms (SOB or cough) since your procedure? no  3.   Have you tested positive for COVID 19 since your procedure no  4.   Have you had any family members/close contacts diagnosed with the COVID 19 since your procedure?  no   If yes to any of these questions please route to Joylene John, RN and Alphonsa Gin, Therapist, sports.   No action needed, pain <4.

## 2019-12-14 DIAGNOSIS — Z23 Encounter for immunization: Secondary | ICD-10-CM | POA: Diagnosis not present

## 2020-01-12 DIAGNOSIS — Z23 Encounter for immunization: Secondary | ICD-10-CM | POA: Diagnosis not present

## 2020-03-29 DIAGNOSIS — E7800 Pure hypercholesterolemia, unspecified: Secondary | ICD-10-CM | POA: Diagnosis not present

## 2020-03-29 DIAGNOSIS — R972 Elevated prostate specific antigen [PSA]: Secondary | ICD-10-CM | POA: Diagnosis not present

## 2020-03-29 DIAGNOSIS — Z1159 Encounter for screening for other viral diseases: Secondary | ICD-10-CM | POA: Diagnosis not present

## 2020-03-29 DIAGNOSIS — Z0000 Encounter for general adult medical examination without abnormal findings: Secondary | ICD-10-CM | POA: Diagnosis not present

## 2020-03-29 DIAGNOSIS — F5104 Psychophysiologic insomnia: Secondary | ICD-10-CM | POA: Diagnosis not present

## 2020-04-06 DIAGNOSIS — D225 Melanocytic nevi of trunk: Secondary | ICD-10-CM | POA: Diagnosis not present

## 2020-04-06 DIAGNOSIS — L570 Actinic keratosis: Secondary | ICD-10-CM | POA: Diagnosis not present

## 2020-04-06 DIAGNOSIS — L814 Other melanin hyperpigmentation: Secondary | ICD-10-CM | POA: Diagnosis not present

## 2020-04-06 DIAGNOSIS — D2261 Melanocytic nevi of right upper limb, including shoulder: Secondary | ICD-10-CM | POA: Diagnosis not present

## 2020-04-06 DIAGNOSIS — D2262 Melanocytic nevi of left upper limb, including shoulder: Secondary | ICD-10-CM | POA: Diagnosis not present

## 2020-06-24 ENCOUNTER — Other Ambulatory Visit: Payer: Federal, State, Local not specified - PPO

## 2020-06-24 ENCOUNTER — Other Ambulatory Visit: Payer: Self-pay

## 2020-06-24 DIAGNOSIS — Z20822 Contact with and (suspected) exposure to covid-19: Secondary | ICD-10-CM

## 2020-06-27 LAB — NOVEL CORONAVIRUS, NAA: SARS-CoV-2, NAA: NOT DETECTED

## 2020-06-28 DIAGNOSIS — H33312 Horseshoe tear of retina without detachment, left eye: Secondary | ICD-10-CM | POA: Diagnosis not present

## 2020-06-28 DIAGNOSIS — Z961 Presence of intraocular lens: Secondary | ICD-10-CM | POA: Diagnosis not present

## 2020-06-28 DIAGNOSIS — H35413 Lattice degeneration of retina, bilateral: Secondary | ICD-10-CM | POA: Diagnosis not present

## 2020-06-28 DIAGNOSIS — H26493 Other secondary cataract, bilateral: Secondary | ICD-10-CM | POA: Diagnosis not present

## 2020-07-06 ENCOUNTER — Ambulatory Visit: Payer: Federal, State, Local not specified - PPO | Admitting: Orthopaedic Surgery

## 2020-07-21 ENCOUNTER — Ambulatory Visit: Payer: Federal, State, Local not specified - PPO | Admitting: Orthopaedic Surgery

## 2020-07-26 ENCOUNTER — Ambulatory Visit: Payer: Federal, State, Local not specified - PPO | Admitting: Orthopaedic Surgery

## 2020-07-26 ENCOUNTER — Encounter: Payer: Self-pay | Admitting: Orthopaedic Surgery

## 2020-07-26 ENCOUNTER — Ambulatory Visit: Payer: Self-pay

## 2020-07-26 ENCOUNTER — Other Ambulatory Visit: Payer: Self-pay

## 2020-07-26 VITALS — Ht 75.5 in | Wt 220.0 lb

## 2020-07-26 DIAGNOSIS — M25562 Pain in left knee: Secondary | ICD-10-CM

## 2020-07-26 DIAGNOSIS — G8929 Other chronic pain: Secondary | ICD-10-CM

## 2020-07-26 NOTE — Progress Notes (Signed)
Office Visit Note   Patient: Wesley Becker           Date of Birth: 1956-02-04           MRN: [id number] Visit Date: 07/26/2020              Requested by: Wesley Orn, MD [place] [employer] [address],  [place] [address] PCP: Wesley Orn, MD   Assessment & Plan: Visit Diagnoses:  1. Chronic pain of left knee     Plan: Wesley Becker experienced insidious onset of left knee pain several months ago.  No history of injury or trauma.  Is not had any swelling but has noted some "achiness" and some loss of flexion compared to the opposite side.  He has been somewhat hesitant to work out at the gym because of his knee and just needed some guidance.  X-rays reveal some mild arthritis.  He is having predominately diffuse mild medial joint pain.  No effusion or evidence of instability.  I suspect the majority of his pain is probably related to arthritis.  He could have a tear of the medial meniscus.  I had several suggestions including cortisone injection, Voltaren gel and even an MRI scan.  He wanted to wait and give a little bit more time to see if it improved.  He will call if he wants to consider any further treatment or x-rays.  I think the Voltaren gel may be of some help and he like to try that  Follow-Up Instructions: Return if symptoms worsen or fail to improve.   Orders:  Orders Placed This Encounter  Procedures   XR KNEE 3 VIEW LEFT   No orders of the defined types were placed in this encounter.     Procedures: No procedures performed   Clinical Data: No additional findings.   Subjective: Chief Complaint  Patient presents with   Left Knee - Pain  Patient presents today for left knee pain. No know injury. He said that it has been hurting for 3-50months. He states that his knee feels tight posteriorly. He has limited motion in his knee. He enjoys working out and wanted to make sure he was not causing any damage to his knee. He takes Ibuprofen if needed.    HPI  Review of Systems  Constitutional: Negative for fatigue.  HENT: Negative for ear pain.   Eyes: Negative for pain.  Respiratory: Negative for shortness of breath.   Cardiovascular: Negative for leg swelling.  Gastrointestinal: Negative for constipation and diarrhea.  Endocrine: Negative for cold intolerance and heat intolerance.  Genitourinary: Negative for difficulty urinating.  Musculoskeletal: Negative for joint swelling.  Skin: Negative for rash.  Allergic/Immunologic: Negative for food allergies.  Neurological: Negative for weakness.  Hematological: Does not bruise/bleed easily.  Psychiatric/Behavioral: Negative for sleep disturbance.     Objective: Vital Signs: Ht 6' 3.5" (1.918 m)    Wt 220 lb (99.8 kg)    BMI 27.14 kg/m   Physical Exam Constitutional:      Appearance: He is well-developed.  Eyes:     Pupils: Pupils are equal, round, and reactive to light.  Pulmonary:     Effort: Pulmonary effort is normal.  Skin:    General: Skin is warm and dry.  Neurological:     Mental Status: He is alert and oriented to person, place, and time.  Psychiatric:        Behavior: Behavior normal.     Ortho Exam awake alert and oriented  x3.  Comfortable sitting.  Left knee was not hot red warm or effused.  No instability with varus valgus stress or anterior drawer sign.  No patella crepitation.  Full extension.  No obvious difference in appearance on the left compared to the right knee.  Does have diffuse mild medial joint pain without localization.  No pain laterally.  No popping or clicking.  Does have some slight loss of flexion in the left compared to the right but still was at least 120 degrees.  No popliteal pain or mass.  No calf pain.  No distal edema.  Motor exam intact.  Walks without a limp Specialty Comments:  No specialty comments available.  Imaging: XR KNEE 3 VIEW LEFT  Result Date: 07/26/2020 Films of the left knee were obtained in several projections  standing.  There is mild decrease in the medial joint space and very small peripheral osteophytes medially and even at the patellofemoral joint.  No acute changes or evidence of ectopic calcification.  Films are consistent with mild to moderate osteoarthritis    PMFS History: Patient Active Problem List   Diagnosis Date Noted   Pain in left knee 07/26/2020   Family history of colon cancer in father- 99's 10/15/2019   Low back pain 06/30/2019   Precordial chest pain 08/25/2014   Past Medical History:  Diagnosis Date   Allergy    seasonal pollen   Hyperlipidemia    Sleep apnea 2000   Couldn't use CPAP, takes sleep medication to help with this    Family History  Problem Relation Age of Onset   Lymphoma Father        dx in his 23s   Heart disease Brother 80       Died of MI   Heart disease Maternal Uncle 89   Breast cancer Paternal Aunt        dx in her 59s   Heart disease Maternal Grandfather        CAD   Cancer Paternal Grandmother        gall bladder cancer   Colon cancer Neg Hx    Colon polyps Neg Hx    Esophageal cancer Neg Hx    Rectal cancer Neg Hx    Stomach cancer Neg Hx     Past Surgical History:  Procedure Laterality Date   CATARACT EXTRACTION, BILATERAL Bilateral    2018   COLONOSCOPY  2011   EYE SURGERY  2010   pt states vitreous removal to eye, not sure which one   HERNIA REPAIR     bilateral inguinal   HYDROCELE EXCISION / REPAIR  2005   pt not sure which side   [place]   Social History   Occupational History   Not on file  Tobacco Use   Smoking status: Never Smoker   Smokeless tobacco: Never Used  Vaping Use   Vaping Use: Never used  Substance and Sexual Activity   Alcohol use: Yes    Alcohol/week: 1.0 standard drink    Types: 1 Cans of beer per week    Comment: rarely   Drug use: No   Sexual activity: Not on file

## 2020-09-08 DIAGNOSIS — K080 Exfoliation of teeth due to systemic causes: Secondary | ICD-10-CM | POA: Diagnosis not present

## 2020-09-15 DIAGNOSIS — Z20822 Contact with and (suspected) exposure to covid-19: Secondary | ICD-10-CM | POA: Diagnosis not present

## 2020-12-08 DIAGNOSIS — H00035 Abscess of left lower eyelid: Secondary | ICD-10-CM | POA: Diagnosis not present

## 2020-12-08 DIAGNOSIS — H00025 Hordeolum internum left lower eyelid: Secondary | ICD-10-CM | POA: Diagnosis not present

## 2021-02-07 DIAGNOSIS — U071 COVID-19: Secondary | ICD-10-CM | POA: Diagnosis not present

## 2021-03-08 DIAGNOSIS — S39011A Strain of muscle, fascia and tendon of abdomen, initial encounter: Secondary | ICD-10-CM | POA: Diagnosis not present

## 2021-03-17 DIAGNOSIS — E7800 Pure hypercholesterolemia, unspecified: Secondary | ICD-10-CM | POA: Diagnosis not present

## 2021-03-17 DIAGNOSIS — F5104 Psychophysiologic insomnia: Secondary | ICD-10-CM | POA: Diagnosis not present

## 2021-03-17 DIAGNOSIS — N401 Enlarged prostate with lower urinary tract symptoms: Secondary | ICD-10-CM | POA: Diagnosis not present

## 2021-03-17 DIAGNOSIS — Z0000 Encounter for general adult medical examination without abnormal findings: Secondary | ICD-10-CM | POA: Diagnosis not present

## 2021-03-17 DIAGNOSIS — R972 Elevated prostate specific antigen [PSA]: Secondary | ICD-10-CM | POA: Diagnosis not present

## 2021-04-06 NOTE — Progress Notes (Signed)
Cardiology Office Note   Date:  04/07/2021   ID:  Wesley Becker, DOB 09-13-1956, MRN [id number]  PCP:  Lavone Orn, MD  Cardiologist:   Minus Breeding, MD Referring:    Chief Complaint  Patient presents with   Shortness of Breath       History of Present Illness: Wesley Becker is a 65 y.o. male who presents for evaluation of SOB.  He is a self referral.    I saw him in January 15, 2014 for chest pain.   He had a negative POET ([employer] Exercise Treadmill) he is pretty active.  When I saw him previously he was out of the stress of his wife having cancer.  She unfortunately died from this in Jan 16, 2015.  He is about to be married.  He does exercise routinely and he does get some shortness of breath with activities but this is mild.  He goes to the gym and does 45 minutes of cardio and does not get chest pressure, neck or arm discomfort.  He does not have any resting shortness of breath, PND or orthopnea.  He has no palpitations, presyncope or syncope.  He has no weight gain or edema.   Past Medical History:  Diagnosis Date   Allergy    seasonal pollen   Hyperlipidemia    Sleep apnea 16-Jan-1999   Couldn't use CPAP, takes sleep medication to help with this    Past Surgical History:  Procedure Laterality Date   CATARACT EXTRACTION, BILATERAL Bilateral    01-15-17   COLONOSCOPY  2010/01/15   EYE SURGERY  01-15-09   pt states vitreous removal to eye, not sure which one   HERNIA REPAIR     bilateral inguinal   HYDROCELE EXCISION / REPAIR  2005   pt not sure which side   PROSTATE BIOPSY     REFRACTIVE SURGERY     VASECTOMY  01/15/94     Current Outpatient Medications  Medication Sig Dispense Refill   Loratadine (CLARITIN PO) Take by mouth.     simvastatin (ZOCOR) 40 MG tablet Take 40 mg by mouth at bedtime.     traZODone (DESYREL) 50 MG tablet Take 50 mg by mouth at bedtime. 1/2 tablet PO     zolpidem (AMBIEN) 5 MG tablet Take 5 mg by mouth at bedtime as needed for sleep.     No current  facility-administered medications for this visit.    Allergies:   Patient has no known allergies.    Social History:  The patient  reports that he has never smoked. He has never used smokeless tobacco. He reports current alcohol use of about 1.0 standard drink of alcohol per week. He reports that he does not use drugs.   Family History:  The patient's family history includes Breast cancer in his paternal aunt; Cancer in his paternal grandmother; Heart disease in his maternal grandfather; Heart disease (age of onset: 77) in his brother and maternal uncle; Lymphoma in his father.    ROS:  Please see the history of present illness.   Otherwise, review of systems are positive for insomnia.   All other systems are reviewed and negative.    PHYSICAL EXAM: VS:  BP 110/88 (BP Location: Left Arm)   Pulse 75   Ht 6\' 4"  (1.93 m)   Wt 208 lb 6.4 oz (94.5 kg)   SpO2 97%   BMI 25.37 kg/m  , BMI Body mass index is 25.37 kg/m. GENERAL:  Well appearing HEENT:  Pupils  equal round and reactive, fundi not visualized, oral mucosa unremarkable NECK:  No jugular venous distention, waveform within normal limits, carotid upstroke brisk and symmetric, no bruits, no thyromegaly LYMPHATICS:  No cervical, inguinal adenopathy LUNGS:  Clear to auscultation bilaterally BACK:  No CVA tenderness CHEST:  Unremarkable HEART:  PMI not displaced or sustained,S1 and S2 within normal limits, no S3, no S4, no clicks, no rubs, no murmurs ABD:  Flat, positive bowel sounds normal in frequency in pitch, no bruits, no rebound, no guarding, no midline pulsatile mass, no hepatomegaly, no splenomegaly EXT:  2 plus pulses throughout, no edema, no cyanosis no clubbing SKIN:  No rashes no nodules NEURO:  Cranial nerves II through XII grossly intact, motor grossly intact throughout PSYCH:  Cognitively intact, oriented to person place and time    EKG:  EKG is ordered today. The ekg ordered today demonstrates NSR, rate 69, axis  leftward, early transition lead V2, no acute ST T wave changes.     Recent Labs: No results found for requested labs within last 8760 hours.    Lipid Panel No results found for: CHOL, TRIG, HDL, CHOLHDL, VLDL, LDLCALC, LDLDIRECT    Wt Readings from Last 3 Encounters:  04/07/21 208 lb 6.4 oz (94.5 kg)  07/26/20 220 lb (99.8 kg)  12/01/19 222 lb 9.6 oz (101 kg)      Other studies Reviewed: Additional studies/ records that were reviewed today include: Labs. Review of the above records demonstrates:  Please see elsewhere in the note.     ASSESSMENT AND PLAN:  SOB:   Given his family history I am going to plan starting with a coronary calcium score.  If this is elevated I will bring him back for stress testing but otherwise we will pursue risk reduction as I think the pretest probability of obstructive coronary disease is low.  DYSLIPIDEMIA: LDL earlier this year was 100.  Triglycerides were 84.  Total cholesterol was 166.  Goals of therapy will be based on the above.   Current medicines are reviewed at length with the patient today.  The patient does not have concerns regarding medicines.  The following changes have been made:  no change  Labs/ tests ordered today include:  Orders Placed This Encounter  Procedures   CT CARDIAC SCORING (SELF PAY ONLY)   EKG 12-Lead      Disposition:   FU with as needed   Signed, Minus Breeding, MD  04/07/2021 11:50 AM    [place]

## 2021-04-07 ENCOUNTER — Encounter: Payer: Self-pay | Admitting: Cardiology

## 2021-04-07 ENCOUNTER — Other Ambulatory Visit: Payer: Self-pay

## 2021-04-07 ENCOUNTER — Ambulatory Visit: Payer: Federal, State, Local not specified - PPO | Admitting: Cardiology

## 2021-04-07 VITALS — BP 110/88 | HR 75 | Ht 76.0 in | Wt 208.4 lb

## 2021-04-07 DIAGNOSIS — R079 Chest pain, unspecified: Secondary | ICD-10-CM

## 2021-04-07 DIAGNOSIS — R0602 Shortness of breath: Secondary | ICD-10-CM | POA: Diagnosis not present

## 2021-04-07 NOTE — Patient Instructions (Signed)
Medication Instructions:  Your physician recommends that you continue on your current medications as directed. Please refer to the Current Medication list given to you today.  *If you need a refill on your cardiac medications before your next appointment, please call your pharmacy*  Lab Work: NONE ordered at this time of appointment   If you have labs (blood work) drawn today and your tests are completely normal, you will receive your results only by: [place] (if you have MyChart) OR A paper copy in the mail If you have any lab test that is abnormal or we need to change your treatment, we will call you to review the results.  Testing/Procedures: Dr. Percival Spanish has ordered a CT coronary calcium score. This test is done at [address]. This is $99 out of pocket.   Coronary CalciumScan A coronary calcium scan is an imaging test used to look for deposits of calcium and other fatty materials (plaques) in the inner lining of the blood vessels of the heart (coronary arteries). These deposits of calcium and plaques can partly clog and narrow the coronary arteries without producing any symptoms or warning signs. This puts a person at risk for a heart attack. This test can detect these deposits before symptoms develop. Tell a health care provider about: Any allergies you have. All medicines you are taking, including vitamins, herbs, eye drops, creams, and over-the-counter medicines. Any problems you or family members have had with anesthetic medicines. Any blood disorders you have. Any surgeries you have had. Any medical conditions you have. Whether you are pregnant or may be pregnant. What are the risks? Generally, this is a safe procedure. However, problems may occur, including: Harm to a pregnant woman and her unborn baby. This test involves the use of radiation. Radiation exposure can be dangerous to a pregnant woman and her unborn baby. If you are pregnant, you  generally should not have this procedure done. Slight increase in the risk of cancer. This is because of the radiation involved in the test. What happens before the procedure? No preparation is needed for this procedure. What happens during the procedure? You will undress and remove any jewelry around your neck or chest. You will put on a hospital gown. Sticky electrodes will be placed on your chest. The electrodes will be connected to an electrocardiogram (ECG) machine to record a tracing of the electrical activity of your heart. A CT scanner will take pictures of your heart. During this time, you will be asked to lie still and hold your breath for 2-3 seconds while a picture of your heart is being taken. The procedure may vary among health care providers and hospitals. What happens after the procedure? You can get dressed. You can return to your normal activities. It is up to you to get the results of your test. Ask your health care provider, or the department that is doing the test, when your results will be ready. Summary A coronary calcium scan is an imaging test used to look for deposits of calcium and other fatty materials (plaques) in the inner lining of the blood vessels of the heart (coronary arteries). Generally, this is a safe procedure. Tell your health care provider if you are pregnant or may be pregnant. No preparation is needed for this procedure. A CT scanner will take pictures of your heart. You can return to your normal activities after the scan is done. This information is not intended to replace advice given to you  by your health care provider. Make sure you discuss any questions you have with your health care provider. Document Released: 03/22/2008 Document Revised: 08/13/2016 Document Reviewed: 08/13/2016 Elsevier Interactive Patient Education  2017 [place]: At [facility], you and your health needs are our priority.  As part of our continuing  mission to provide you with exceptional heart care, we have created designated Provider Care Teams.  These Care Teams include your primary Cardiologist (physician) and Advanced Practice Providers (APPs -  Physician Assistants and Nurse Practitioners) who all work together to provide you with the care you need, when you need it.  We recommend signing up for the patient portal called "MyChart".  Sign up information is provided on this After Visit Summary.  MyChart is used to connect with patients for Virtual Visits (Telemedicine).  Patients are able to view lab/test results, encounter notes, upcoming appointments, etc.  Non-urgent messages can be sent to your provider as well.   To learn more about what you can do with MyChart, go to [email or web address].    Your next appointment:   As needed if symptoms worsen, fail to improve or new symptoms develop   The format for your next appointment:   In Person  Provider:   You may see Minus Breeding, MD or one of the following Advanced Practice Providers on your designated Care Team:   Rosaria Ferries, PA-C Jory Sims, DNP, ANP  Other Instructions

## 2021-04-18 DIAGNOSIS — D2261 Melanocytic nevi of right upper limb, including shoulder: Secondary | ICD-10-CM | POA: Diagnosis not present

## 2021-04-18 DIAGNOSIS — L570 Actinic keratosis: Secondary | ICD-10-CM | POA: Diagnosis not present

## 2021-04-18 DIAGNOSIS — D225 Melanocytic nevi of trunk: Secondary | ICD-10-CM | POA: Diagnosis not present

## 2021-04-18 DIAGNOSIS — D1801 Hemangioma of skin and subcutaneous tissue: Secondary | ICD-10-CM | POA: Diagnosis not present

## 2021-04-18 DIAGNOSIS — L821 Other seborrheic keratosis: Secondary | ICD-10-CM | POA: Diagnosis not present

## 2021-04-28 ENCOUNTER — Encounter: Payer: Self-pay | Admitting: Emergency Medicine

## 2021-04-28 ENCOUNTER — Other Ambulatory Visit: Payer: Self-pay

## 2021-04-28 ENCOUNTER — Ambulatory Visit
Admission: EM | Admit: 2021-04-28 | Discharge: 2021-04-28 | Disposition: A | Discharge status: Home / Self Care | Payer: Federal, State, Local not specified - PPO | Attending: Emergency Medicine | Admitting: Emergency Medicine

## 2021-04-28 DIAGNOSIS — S61309A Unspecified open wound of unspecified finger with damage to nail, initial encounter: Secondary | ICD-10-CM | POA: Diagnosis not present

## 2021-04-28 MED ORDER — CEPHALEXIN 500 MG PO CAPS: 500.0000 mg | ORAL_CAPSULE | Freq: Four times a day (QID) | ORAL | 0 refills | Status: AC

## 2021-04-28 NOTE — ED Triage Notes (Signed)
Left nail thumb/lacertion injury today.

## 2021-04-28 NOTE — ED Provider Notes (Signed)
[facility]    CSN: [id number] Arrival date & time: 04/28/21  [id number]      History   Chief Complaint Chief Complaint  Patient presents with   thumb injury    Left     HPI Wesley Becker is a 65 y.o. male presenting today for left nail injury.  Reports that he was using a ladder this morning and some got caught causing his nail to lift from the nailbed.  Initially concerned about fracture, but patient denies this concern at present.  Last tetanus in 2019.  HPI  Past Medical History:  Diagnosis Date   Allergy    seasonal pollen   Hyperlipidemia    Sleep apnea 2000   Couldn't use CPAP, takes sleep medication to help with this    Patient Active Problem List   Diagnosis Date Noted   Pain in left knee 07/26/2020   Family history of colon cancer in father- 38's 10/15/2019   Low back pain 06/30/2019   Precordial chest pain 08/25/2014    Past Surgical History:  Procedure Laterality Date   CATARACT EXTRACTION, BILATERAL Bilateral    2018   COLONOSCOPY  2011   EYE SURGERY  2010   pt states vitreous removal to eye, not sure which one   HERNIA REPAIR     bilateral inguinal   HYDROCELE EXCISION / REPAIR  2005   pt not sure which side   [place] Medications    Prior to Admission medications   Medication Sig Start Date End Date Taking? Authorizing Provider  Loratadine (CLARITIN PO) Take by mouth.    [provider]  simvastatin (ZOCOR) 40 MG tablet Take 40 mg by mouth at bedtime.    [provider]  traZODone (DESYREL) 50 MG tablet Take 50 mg by mouth at bedtime. 1/2 tablet PO    [provider]  zolpidem (AMBIEN) 5 MG tablet Take 5 mg by mouth at bedtime as needed for sleep.    [provider]    Family History Family History  Problem Relation Age of Onset   Lymphoma Father        dx in his 22s   Heart disease Brother 35       Died of MI   Heart disease  Maternal Uncle 61   Breast cancer Paternal Aunt        dx in her 46s   Heart disease Maternal Grandfather        CAD   Cancer Paternal Grandmother        gall bladder cancer   Colon cancer Neg Hx    Colon polyps Neg Hx    Esophageal cancer Neg Hx    Rectal cancer Neg Hx    Stomach cancer Neg Hx     Social History Social History   Tobacco Use   Smoking status: Never   Smokeless tobacco: Never  Vaping Use   Vaping Use: Never used  Substance Use Topics   Alcohol use: Yes    Alcohol/week: 1.0 standard drink    Types: 1 Cans of beer per week    Comment: rarely   Drug use: No     Allergies   Patient has no known allergies.   Review of Systems Review of Systems  Constitutional:  Negative for fatigue and fever.  Eyes:  Negative for redness, itching and visual disturbance.  Respiratory:  Negative for shortness of breath.   Cardiovascular:  Negative for chest pain and leg swelling.  Gastrointestinal:  Negative for nausea and vomiting.  Musculoskeletal:  Negative for arthralgias and myalgias.  Skin:  Positive for color change and wound. Negative for rash.  Neurological:  Negative for dizziness, syncope, weakness, light-headedness and headaches.    Physical Exam Triage Vital Signs ED Triage Vitals  Enc Vitals Group     BP 04/28/21 1000 (!) 148/90     Pulse Rate 04/28/21 1000 76     Resp 04/28/21 1000 18     Temp 04/28/21 1000 98 F (36.7 C)     Temp Source 04/28/21 1000 Oral     SpO2 04/28/21 1000 97 %     Weight 04/28/21 1001 205 lb (93 kg)     Height --      Head Circumference --      Peak Flow --      Pain Score 04/28/21 1001 3     Pain Loc --      Pain Edu? --      Excl. in [place]? --    No data found.  Updated Vital Signs BP (!) 148/90 (BP Location: Right Arm)   Pulse 76   Temp 98 F (36.7 C) (Oral)   Resp 18   Wt 205 lb (93 kg)   SpO2 97%   BMI 24.95 kg/m   Visual Acuity Right Eye Distance:   Left Eye Distance:   Bilateral Distance:    Right  Eye Near:   Left Eye Near:    Bilateral Near:     Physical Exam Vitals and nursing note reviewed.  Constitutional:      Appearance: He is well-developed.     Comments: No acute distress  HENT:     Head: Normocephalic and atraumatic.     Nose: Nose normal.  Eyes:     Conjunctiva/sclera: Conjunctivae normal.  Cardiovascular:     Rate and Rhythm: Normal rate.  Pulmonary:     Effort: Pulmonary effort is normal. No respiratory distress.  Abdominal:     General: There is no distension.  Musculoskeletal:        General: Normal range of motion.     Cervical back: Neck supple.  Skin:    General: Skin is warm and dry.     Comments: Left thumb with nail largely avulsed from nail bed, only attached at the distal medial aspect, does not appear to be within the cuticle fold, skin jagged around nailbed  Neurological:     Mental Status: He is alert and oriented to person, place, and time.     UC Treatments / Results  Labs (all labs ordered are listed, but only abnormal results are displayed) Labs Reviewed - No data to display  EKG   Radiology No results found.  Procedures Nerve Block  Date/Time: 04/28/2021 11:36 AM Performed by: Kaio Kuhlman, [place] C, PA-C Authorized by: Stamatia Masri, Elesa Hacker, PA-C   Consent:    Consent obtained:  Verbal   Consent given by:  Patient   Risks discussed:  Unsuccessful block, pain, swelling and nerve damage   Alternatives discussed:  No treatment Universal protocol:    Patient identity confirmed:  Verbally with patient Indications:    Indications:  Procedural anesthesia Location:    Nerve block body site: thumb.   Laterality:  Left Skin anesthesia:    Skin anesthesia method:  None Procedure details:    Block needle gauge:  25  G   Anesthetic injected:  Lidocaine 1% w/o epi   Injection procedure:  Anatomic landmarks identified and introduced needle Post-procedure details:    Dressing:  None   Outcome:  Pain improved   Procedure completion:   Tolerated well, no immediate complications (including critical care time)  Nail was further detached from distal portion still adhered with forceps, nail edges trimmed and removed jagged skin from around edges of nail bed, cleansed with dermal wound cleanser and saline to remove clotted blood, gently reinserted nail into cuticle fold proximally to keep open to attempt to have normal nail growth moving forward.  Wrapped with nonadherent and Coban and encouraged follow-up with hand for further monitoring and management.  Medications Ordered in UC Medications - No data to display  Initial Impression / Assessment and Plan / UC Course  I have reviewed the triage vital signs and the nursing notes.  Pertinent labs & imaging results that were available during my care of the patient were reviewed by me and considered in my medical decision making (see chart for details).     Nail avulsion, reinserted into cuticle bed with wound care, encouraged follow-up with hand for monitoring, did discuss risks of nail not growing back or having abnormal nail moving forward.  Monitor for signs of infection, keep clean and dry.  Discussed strict return precautions. Patient verbalized understanding and is agreeable with plan.  Final Clinical Impressions(s) / UC Diagnoses   Final diagnoses:  Traumatic avulsion of nail plate of finger, initial encounter     Discharge Instructions      Keep nail clean and dry, will cleanse with warm soapy water 1-2 times daily and dry well, try to keep nail within nail fold Use nonadherent dressing to avoid sticking around nail Tylenol for pain Follow-up with hand next week for recheck of wound      ED Prescriptions   None    PDMP not reviewed this encounter.   Janith Lima, PA-C 04/28/21 1139

## 2021-04-28 NOTE — Discharge Instructions (Addendum)
Keep nail clean and dry, will cleanse with warm soapy water 1-2 times daily and dry well, try to keep nail within nail fold Use nonadherent dressing to avoid sticking around nail Tylenol for pain Follow-up with hand next week for recheck of wound

## 2021-05-01 DIAGNOSIS — S6702XA Crushing injury of left thumb, initial encounter: Secondary | ICD-10-CM | POA: Diagnosis not present

## 2021-05-01 DIAGNOSIS — M79645 Pain in left finger(s): Secondary | ICD-10-CM | POA: Diagnosis not present

## 2021-05-10 ENCOUNTER — Other Ambulatory Visit: Payer: Self-pay

## 2021-05-10 ENCOUNTER — Ambulatory Visit (INDEPENDENT_AMBULATORY_CARE_PROVIDER_SITE_OTHER)
Admission: RE | Admit: 2021-05-10 | Discharge: 2021-05-10 | Disposition: A | Discharge status: Home / Self Care | Payer: Self-pay | Source: Ambulatory Visit | Attending: Cardiology | Admitting: Cardiology

## 2021-05-10 DIAGNOSIS — R079 Chest pain, unspecified: Secondary | ICD-10-CM

## 2021-05-10 DIAGNOSIS — R0602 Shortness of breath: Secondary | ICD-10-CM

## 2021-05-17 DIAGNOSIS — R972 Elevated prostate specific antigen [PSA]: Secondary | ICD-10-CM | POA: Diagnosis not present

## 2021-06-29 DIAGNOSIS — H33312 Horseshoe tear of retina without detachment, left eye: Secondary | ICD-10-CM | POA: Diagnosis not present

## 2021-06-29 DIAGNOSIS — H15831 Staphyloma posticum, right eye: Secondary | ICD-10-CM | POA: Diagnosis not present

## 2021-06-29 DIAGNOSIS — Z961 Presence of intraocular lens: Secondary | ICD-10-CM | POA: Diagnosis not present

## 2021-06-29 DIAGNOSIS — H35413 Lattice degeneration of retina, bilateral: Secondary | ICD-10-CM | POA: Diagnosis not present

## 2021-08-28 DIAGNOSIS — R972 Elevated prostate specific antigen [PSA]: Secondary | ICD-10-CM | POA: Diagnosis not present

## 2021-08-28 DIAGNOSIS — R311 Benign essential microscopic hematuria: Secondary | ICD-10-CM | POA: Diagnosis not present

## 2021-08-28 DIAGNOSIS — N411 Chronic prostatitis: Secondary | ICD-10-CM | POA: Diagnosis not present

## 2021-09-04 DIAGNOSIS — N400 Enlarged prostate without lower urinary tract symptoms: Secondary | ICD-10-CM | POA: Diagnosis not present

## 2021-11-09 DIAGNOSIS — R351 Nocturia: Secondary | ICD-10-CM | POA: Diagnosis not present

## 2021-11-09 DIAGNOSIS — N401 Enlarged prostate with lower urinary tract symptoms: Secondary | ICD-10-CM | POA: Diagnosis not present

## 2021-11-17 DIAGNOSIS — R972 Elevated prostate specific antigen [PSA]: Secondary | ICD-10-CM | POA: Diagnosis not present

## 2021-11-17 DIAGNOSIS — N401 Enlarged prostate with lower urinary tract symptoms: Secondary | ICD-10-CM | POA: Diagnosis not present

## 2021-11-17 DIAGNOSIS — N411 Chronic prostatitis: Secondary | ICD-10-CM | POA: Diagnosis not present

## 2021-11-17 DIAGNOSIS — R351 Nocturia: Secondary | ICD-10-CM | POA: Diagnosis not present

## 2022-01-16 DIAGNOSIS — M25522 Pain in left elbow: Secondary | ICD-10-CM | POA: Diagnosis not present

## 2022-01-16 DIAGNOSIS — M25521 Pain in right elbow: Secondary | ICD-10-CM | POA: Diagnosis not present

## 2022-01-18 ENCOUNTER — Ambulatory Visit: Payer: Federal, State, Local not specified - PPO | Admitting: Orthopaedic Surgery

## 2022-03-20 DIAGNOSIS — Z0000 Encounter for general adult medical examination without abnormal findings: Secondary | ICD-10-CM | POA: Diagnosis not present

## 2022-03-20 DIAGNOSIS — F5104 Psychophysiologic insomnia: Secondary | ICD-10-CM | POA: Diagnosis not present

## 2022-03-20 DIAGNOSIS — E7800 Pure hypercholesterolemia, unspecified: Secondary | ICD-10-CM | POA: Diagnosis not present

## 2022-03-20 DIAGNOSIS — R972 Elevated prostate specific antigen [PSA]: Secondary | ICD-10-CM | POA: Diagnosis not present

## 2022-03-20 DIAGNOSIS — M7712 Lateral epicondylitis, left elbow: Secondary | ICD-10-CM | POA: Diagnosis not present

## 2022-04-18 ENCOUNTER — Ambulatory Visit: Payer: Federal, State, Local not specified - PPO | Admitting: Orthopaedic Surgery

## 2022-04-18 ENCOUNTER — Ambulatory Visit (INDEPENDENT_AMBULATORY_CARE_PROVIDER_SITE_OTHER): Payer: Federal, State, Local not specified - PPO

## 2022-04-18 ENCOUNTER — Encounter: Payer: Self-pay | Admitting: Orthopaedic Surgery

## 2022-04-18 DIAGNOSIS — M79605 Pain in left leg: Secondary | ICD-10-CM

## 2022-04-18 DIAGNOSIS — G8929 Other chronic pain: Secondary | ICD-10-CM | POA: Diagnosis not present

## 2022-04-18 DIAGNOSIS — M5442 Lumbago with sciatica, left side: Secondary | ICD-10-CM | POA: Diagnosis not present

## 2022-04-18 NOTE — Progress Notes (Signed)
Office Visit Note   Patient: Wesley Becker           Date of Birth: [birth year]           MRN: [id number] Visit Date: 04/18/2022              Requested by: Lavone Orn, MD [facility] [address],  [place] [address] PCP: Lavone Orn, MD   Assessment & Plan: Visit Diagnoses:  1. Pain in left leg   2. Chronic bilateral low back pain with left-sided sciatica     Plan: [name] has been experiencing pain beginning the in the lateral left buttock radiating along the lateral aspect of his thigh into his calf for 3 to 4 months.  No history of injury or trauma.  No bowel or bladder changes.  He did have a history of low back pain in the past and in fact had an MRI scan in 2020.  That demonstrated multilevel multifactorial subarticular stenosis most notably on the right at L2-3 and L4-5 and multilevel neuroforaminal narrowing greatest on the left at L3-4 and on the left at L4-5.  He has not had any right leg pain.  He is not having much back pain.  I suspect that the problem with his left leg is related to his back.  From a diagnostic and therapeutic standpoint I will order an epidural steroid injection and give him a set of back exercises not to check him back within a week or 2 after the injection.  If he continues to have a problem without much relief I think I would repeat the MRI scan  Follow-Up Instructions: Return in about 1 month (around 05/19/2022).   Orders:  Orders Placed This Encounter  Procedures   XR Lumbar Spine 2-3 Views   XR Pelvis 1-2 Views   Epidural Steroid Injection - Lumbar/Sacral (Ancillary Performed)   No orders of the defined types were placed in this encounter.     Procedures: No procedures performed   Clinical Data: No additional findings.   Subjective: Chief Complaint  Patient presents with   Left Leg - Pain  Patient presents today for left leg pain. He said that he has been having pain in the lateral hip area that radiates down his  lateral left leg. No lower back pain. No groin pain. No numbness or tingling  in his legs. He said that he has no pain with rest. He walks for exercise and notices that it starts around 1/8mle into his walk.  He also has questions about his left elbow. He said that he has been diagnosed with tennis elbow and wants to know if he can wear a brace.  HPI  Review of Systems   Objective: Vital Signs: There were no vitals taken for this visit.  Physical Exam Constitutional:      Appearance: He is well-developed.  Eyes:     Pupils: Pupils are equal, round, and reactive to light.  Pulmonary:     Effort: Pulmonary effort is normal.  Skin:    General: Skin is warm and dry.  Neurological:     Mental Status: He is alert and oriented to person, place, and time.  Psychiatric:        Behavior: Behavior normal.     Ortho Exam awake alert and oriented x3.  Comfortable sitting.  Straight leg raise negative bilaterally.  Normal motor strength.  Painless range of motion of both hips without loss of motion of either hip.  No pain over either greater trochanter.  No percussible lumbar spine discomfort.  Pelvis was level.  Specialty Comments:  No specialty comments available.  Imaging: XR Pelvis 1-2 Views  Result Date: 04/18/2022 AP the pelvis did not demonstrate any obvious degenerative change of either hip.  The SI joints were clear.  No acute changes.  Some calcification along the lateral aspect of the acetabulum bilaterally more so on right than left.  Joint spaces are well-maintained  XR Lumbar Spine 2-3 Views  Result Date: 04/18/2022 Films lumbar spine obtained in 2 projections.  There is a degenerative scoliosis to the left of about 8 to 10 degrees.  Diffuse degenerative disc disease with narrowing and anterior osteophytes throughout the lumbar spine and straightening of the normal lordosis.  No acute changes.  No calcification of the abdominal aorta.  There are facet joint degenerative  changes and sclerosis at L4-5 and L5-S1.  No listhesis    PMFS History: Patient Active Problem List   Diagnosis Date Noted   Pain in left knee 07/26/2020   Family history of colon cancer in father- 35's 10/15/2019   Low back pain 06/30/2019   Precordial chest pain 08/25/2014   Past Medical History:  Diagnosis Date   Allergy    seasonal pollen   Hyperlipidemia    Sleep apnea 2000   Couldn't use CPAP, takes sleep medication to help with this    Family History  Problem Relation Age of Onset   Lymphoma Father        dx in his 49s   Heart disease Brother 31       Died of MI   Heart disease Maternal Uncle 11   Breast cancer Paternal Aunt        dx in her 15s   Heart disease Maternal Grandfather        CAD   Cancer Paternal Grandmother        gall bladder cancer   Colon cancer Neg Hx    Colon polyps Neg Hx    Esophageal cancer Neg Hx    Rectal cancer Neg Hx    Stomach cancer Neg Hx     Past Surgical History:  Procedure Laterality Date   CATARACT EXTRACTION, BILATERAL Bilateral    2018   COLONOSCOPY  2011   EYE SURGERY  2010   pt states vitreous removal to eye, not sure which one   HERNIA REPAIR     bilateral inguinal   HYDROCELE EXCISION / REPAIR  2005   pt not sure which side   [place]   Social History   Occupational History   Not on file  Tobacco Use   Smoking status: Never   Smokeless tobacco: Never  Vaping Use   Vaping Use: Never used  Substance and Sexual Activity   Alcohol use: Yes    Alcohol/week: 1.0 standard drink of alcohol    Types: 1 Cans of beer per week    Comment: rarely   Drug use: No   Sexual activity: Not on file

## 2022-04-27 ENCOUNTER — Encounter: Payer: Self-pay | Admitting: Orthopaedic Surgery

## 2022-04-30 DIAGNOSIS — D225 Melanocytic nevi of trunk: Secondary | ICD-10-CM | POA: Diagnosis not present

## 2022-04-30 DIAGNOSIS — L814 Other melanin hyperpigmentation: Secondary | ICD-10-CM | POA: Diagnosis not present

## 2022-04-30 DIAGNOSIS — L578 Other skin changes due to chronic exposure to nonionizing radiation: Secondary | ICD-10-CM | POA: Diagnosis not present

## 2022-04-30 DIAGNOSIS — L821 Other seborrheic keratosis: Secondary | ICD-10-CM | POA: Diagnosis not present

## 2022-05-03 DIAGNOSIS — Z5181 Encounter for therapeutic drug level monitoring: Secondary | ICD-10-CM | POA: Diagnosis not present

## 2022-05-03 DIAGNOSIS — E7800 Pure hypercholesterolemia, unspecified: Secondary | ICD-10-CM | POA: Diagnosis not present

## 2022-05-17 ENCOUNTER — Other Ambulatory Visit: Payer: Federal, State, Local not specified - PPO

## 2022-06-08 ENCOUNTER — Other Ambulatory Visit: Payer: Self-pay | Admitting: Orthopaedic Surgery

## 2022-06-08 ENCOUNTER — Telehealth: Payer: Self-pay | Admitting: Orthopaedic Surgery

## 2022-06-08 DIAGNOSIS — M25552 Pain in left hip: Secondary | ICD-10-CM

## 2022-06-08 NOTE — Telephone Encounter (Signed)
Patient called. He has some questions about the injection he will be getting at [facility]. His call back number is [phone]

## 2022-06-12 ENCOUNTER — Encounter: Payer: Self-pay | Admitting: Cardiology

## 2022-06-13 DIAGNOSIS — Z961 Presence of intraocular lens: Secondary | ICD-10-CM | POA: Diagnosis not present

## 2022-06-13 DIAGNOSIS — H15831 Staphyloma posticum, right eye: Secondary | ICD-10-CM | POA: Diagnosis not present

## 2022-06-13 DIAGNOSIS — H524 Presbyopia: Secondary | ICD-10-CM | POA: Diagnosis not present

## 2022-06-13 DIAGNOSIS — H04123 Dry eye syndrome of bilateral lacrimal glands: Secondary | ICD-10-CM | POA: Diagnosis not present

## 2022-06-13 DIAGNOSIS — H33312 Horseshoe tear of retina without detachment, left eye: Secondary | ICD-10-CM | POA: Diagnosis not present

## 2022-06-14 ENCOUNTER — Ambulatory Visit
Admission: RE | Admit: 2022-06-14 | Discharge: 2022-06-14 | Disposition: A | Discharge status: Home / Self Care | Payer: Federal, State, Local not specified - PPO | Source: Ambulatory Visit | Attending: Orthopaedic Surgery | Admitting: Orthopaedic Surgery

## 2022-06-14 DIAGNOSIS — M25552 Pain in left hip: Secondary | ICD-10-CM

## 2022-06-14 DIAGNOSIS — M5116 Intervertebral disc disorders with radiculopathy, lumbar region: Secondary | ICD-10-CM | POA: Diagnosis not present

## 2022-06-14 MED ORDER — IOPAMIDOL (ISOVUE-M 200) INJECTION 41%
1.0000 mL | Freq: Once | INTRAMUSCULAR | Status: AC
  Administered 2022-06-14: 1 mL via EPIDURAL

## 2022-06-14 MED ORDER — METHYLPREDNISOLONE ACETATE 40 MG/ML INJ SUSP (RADIOLOG
80.0000 mg | Freq: Once | INTRAMUSCULAR | Status: AC
  Administered 2022-06-14: 80 mg via EPIDURAL

## 2022-06-14 NOTE — Discharge Instructions (Signed)

## 2022-06-25 IMAGING — CT CT CARDIAC CORONARY ARTERY CALCIUM SCORE
3 series · 14 of 20 positions shown, 15 images · non-contrast
Comparison: Chest CTA 07/08/2014.
COMPARISON: Chest CTA 07/08/2014.

Addendum:
EXAM:
OVER-READ INTERPRETATION  CT CHEST

The following report is an over-read performed by radiologist Dr.
Xiang Picazo [REDACTED] on 05/10/2021. This
over-read does not include interpretation of cardiac or coronary
anatomy or pathology. The coronary calcium score interpretation by
the cardiologist is attached.
CLINICAL DATA: Cardiovascular Disease Risk stratification
Coronary Calcium Score
TECHNIQUE: A gated, non-contrast computed tomography scan of the heart was
performed using 3mm slice thickness. Axial images were analyzed on a
dedicated workstation. Calcium scoring of the coronary arteries was
performed using the Agatston method.

[Series 2: casc 3.0 bv41 2 bestdiast 71 % · axial · 0.48mm/px · z∈[+1300,+1396]mm · 4 of 54 slices shown, 5 images]
[im 11/54  vessel]
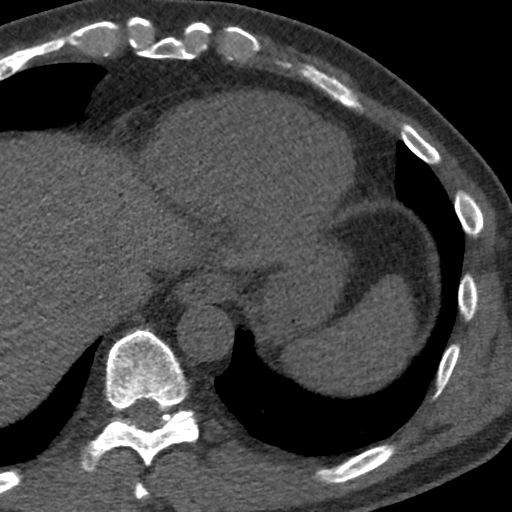
[im 11/54  lung]
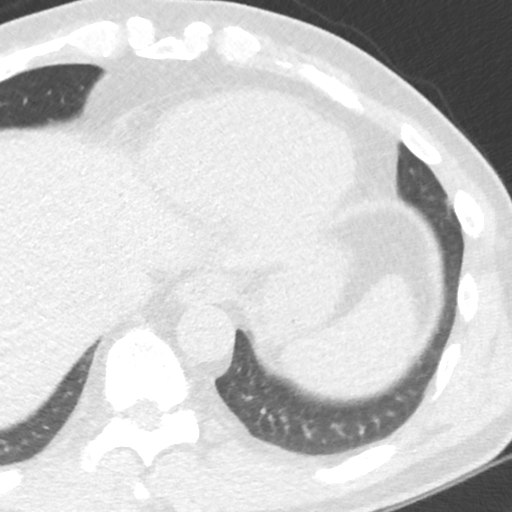
[im 22/54  vessel]
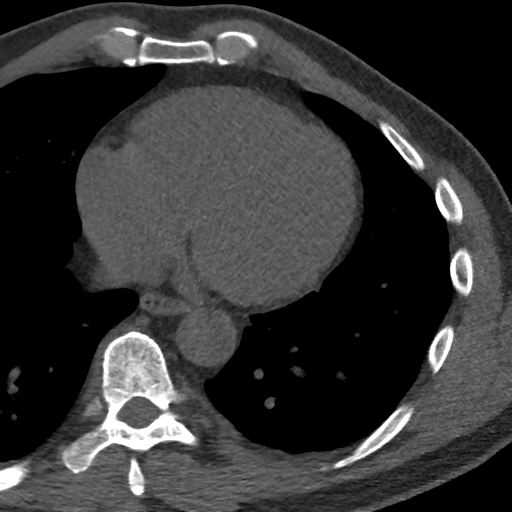
[im 32/54  vessel]
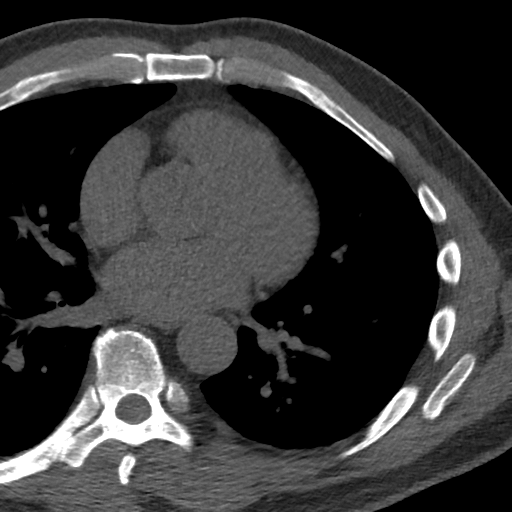
[im 43/54  vessel]
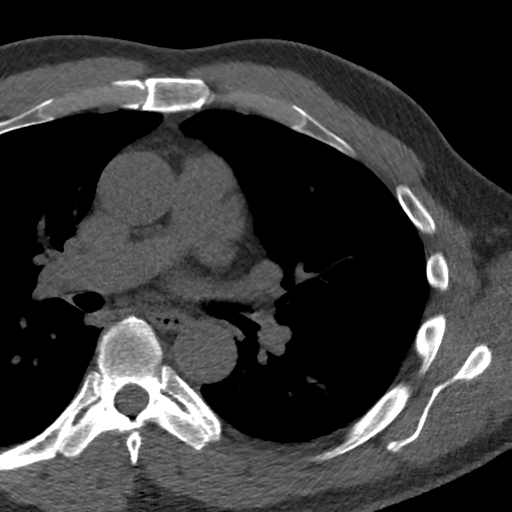

[Series 3: lung 71 % · axial · 0.68mm/px · z∈[+1294,+1402]mm · 5 of 54 slices shown]
[im 9/54  lung]
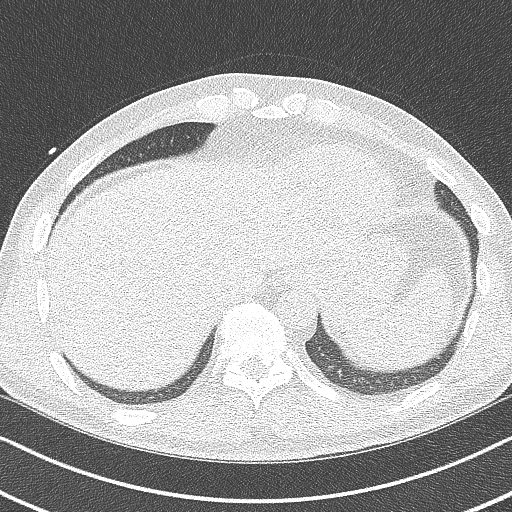
[im 18/54  lung]
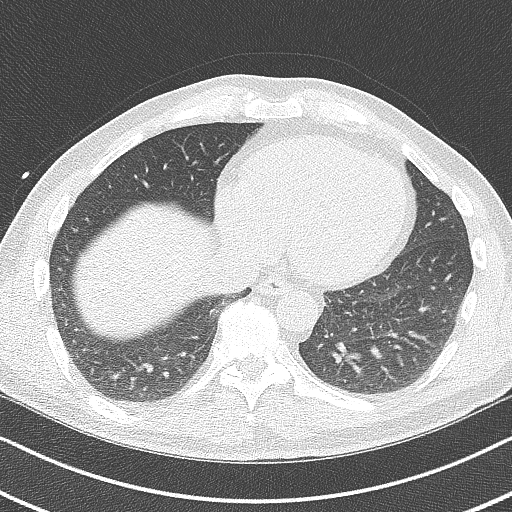
[im 27/54  lung]
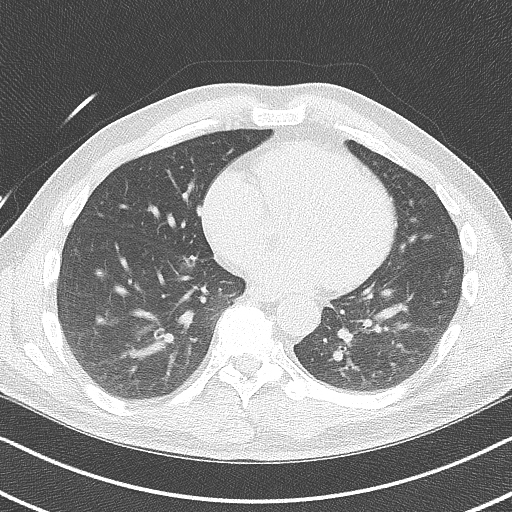
[im 36/54  lung]
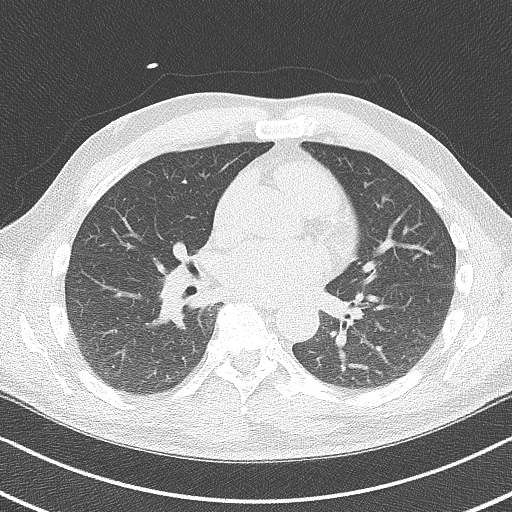
[im 45/54  lung]
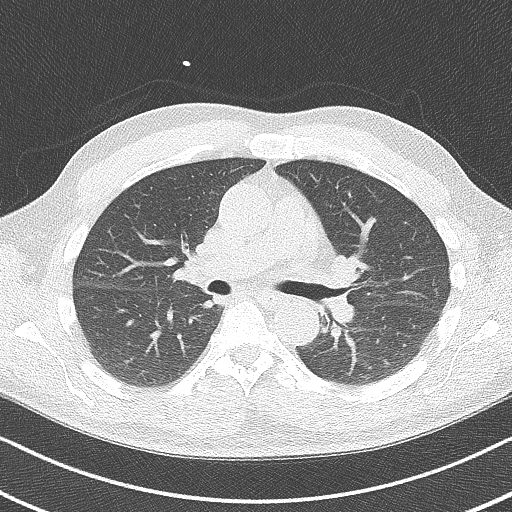

[Series 4: lung st 71 % · axial · 0.68mm/px · z∈[+1294,+1402]mm · 5 of 54 slices shown]
[im 9/54  lung]
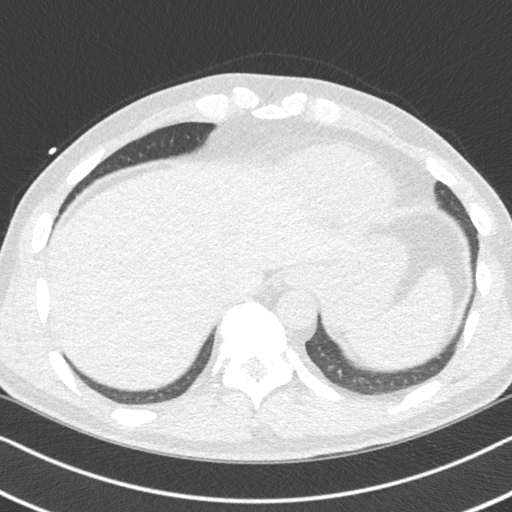
[im 18/54  lung]
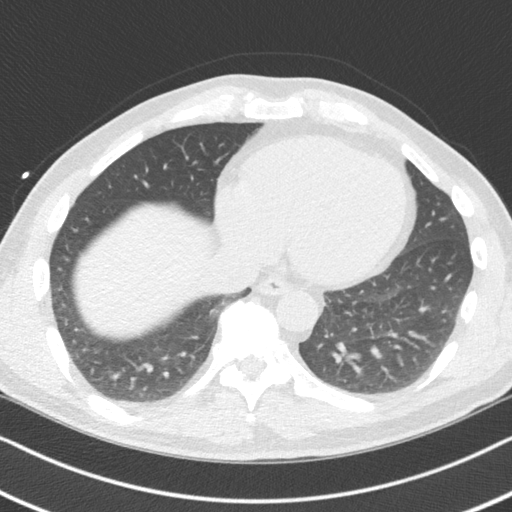
[im 27/54  lung]
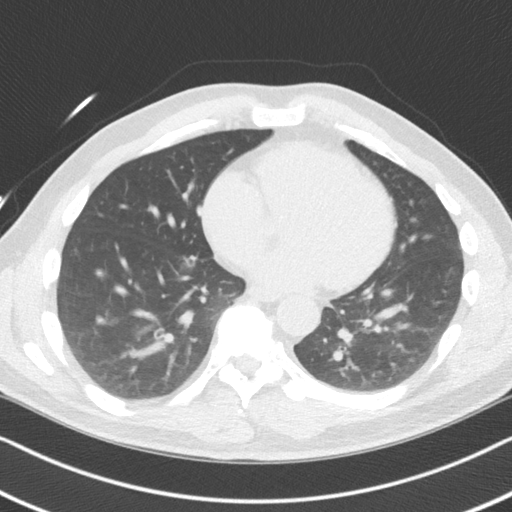
[im 36/54  lung]
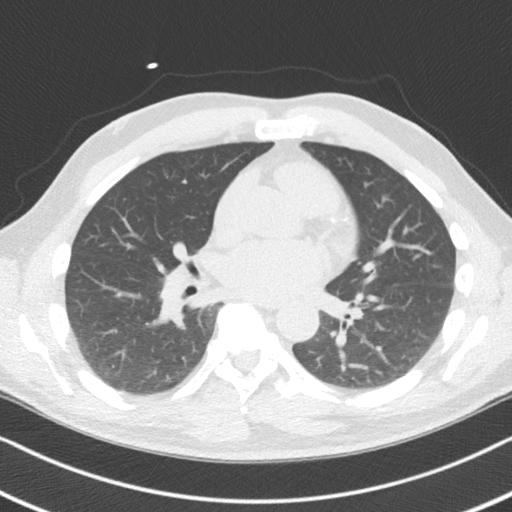
[im 45/54  lung]
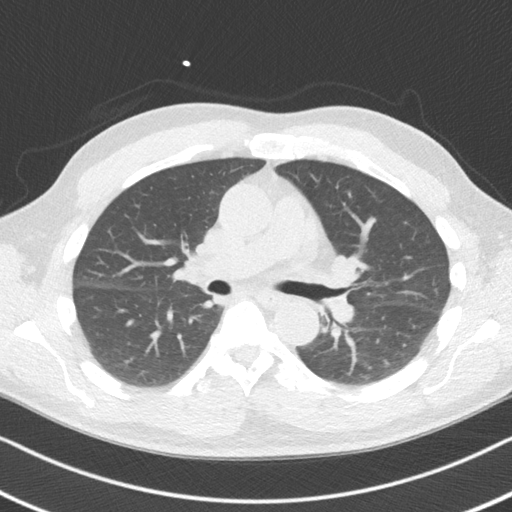

[14 of 20 positions shown; findings below may reference images not displayed]

FINDINGS: Within the visualized portions of the thorax there are no suspicious
appearing pulmonary nodules or masses, there is no acute
consolidative airspace disease, no pleural effusions, no
pneumothorax and no lymphadenopathy. Visualized portions of the
upper abdomen are unremarkable. There are no aggressive appearing
lytic or blastic lesions noted in the visualized portions of the
skeleton.
IMPRESSION: 1. No significant incidental noncardiac findings are noted.
FINDINGS: Coronary Calcium Score:

Left main: 0

Left anterior descending artery:

Left circumflex artery: 0

Right coronary artery: 0

Total:

Percentile: 35th

Pericardium: Normal.

Ascending Aorta: Normal caliber.

Non-cardiac: See separate report from [REDACTED].
IMPRESSION: Coronary calcium score of 14.2. This was 35th percentile for age-,
race-, and sex-matched controls.



If CAC=0, it is reasonable to withhold statin therapy and reassess
in 5 to 10 years, as long as higher risk conditions are absent
(diabetes mellitus, family history of premature CHD in first degree
relatives (males <55 years; females <65 years), cigarette smoking,
or LDL >=190 mg/dL).

If CAC is 1 to 99, it is reasonable to initiate statin therapy for
patients >=55 years of age.

If CAC is >=100 or >=75th percentile, it is reasonable to initiate
statin therapy at any age.

Cardiology referral should be considered for patients with CAC
scores >=400 or >=75th percentile.

*9365 AHA/ACC/AACVPR/AAPA/ABC/JESTO/AMPTMEIJER/PRENTICE/Tiger/GAASCH/VIKA/CHHOKAR
Guideline on the Management of Blood Cholesterol: A Report of the
American College of Cardiology/American Heart Association Task Force
on Clinical Practice Guidelines. J Am Coll Cardiol.
4335;73(24):5747-5044.

*** End of Addendum ***
EXAM:
OVER-READ INTERPRETATION  CT CHEST

The following report is an over-read performed by radiologist Dr.
Xiang Picazo [REDACTED] on 05/10/2021. This
over-read does not include interpretation of cardiac or coronary
anatomy or pathology. The coronary calcium score interpretation by
the cardiologist is attached.
FINDINGS: Within the visualized portions of the thorax there are no suspicious
appearing pulmonary nodules or masses, there is no acute
consolidative airspace disease, no pleural effusions, no
pneumothorax and no lymphadenopathy. Visualized portions of the
upper abdomen are unremarkable. There are no aggressive appearing
lytic or blastic lesions noted in the visualized portions of the
skeleton.
IMPRESSION: 1. No significant incidental noncardiac findings are noted.

## 2022-07-02 DIAGNOSIS — Z5181 Encounter for therapeutic drug level monitoring: Secondary | ICD-10-CM | POA: Diagnosis not present

## 2022-07-02 DIAGNOSIS — E7800 Pure hypercholesterolemia, unspecified: Secondary | ICD-10-CM | POA: Diagnosis not present

## 2022-08-15 ENCOUNTER — Ambulatory Visit: Payer: Federal, State, Local not specified - PPO | Admitting: Orthopaedic Surgery

## 2022-08-15 ENCOUNTER — Encounter: Payer: Self-pay | Admitting: Orthopaedic Surgery

## 2022-08-15 ENCOUNTER — Other Ambulatory Visit: Payer: Self-pay | Admitting: Physical Medicine and Rehabilitation

## 2022-08-15 DIAGNOSIS — M5442 Lumbago with sciatica, left side: Secondary | ICD-10-CM

## 2022-08-15 DIAGNOSIS — M5416 Radiculopathy, lumbar region: Secondary | ICD-10-CM

## 2022-08-15 DIAGNOSIS — G8929 Other chronic pain: Secondary | ICD-10-CM | POA: Diagnosis not present

## 2022-08-15 NOTE — Progress Notes (Signed)
Office Visit Note   Patient: Wesley Becker           Date of Birth: 04/09/1956           MRN: [id number] Visit Date: 08/15/2022              Requested by: Lavone Orn, MD [facility] [place] [address] [place],  [place] [address] PCP: Lavone Orn, MD   Assessment & Plan: Visit Diagnoses:  1. Chronic bilateral low back pain with left-sided sciatica     Plan: Wesley Becker was last seen in July for evaluation of low back pain associated with referred discomfort into his left lower extremity.  He had similar pain in 2020.  An MRI scan at that time revealed areas of multifactorial subarticular stenosis most notably on the right at L2-3 and L4-5 and multilevel neuroforaminal narrowing greatest on the left at L3-4 on the left at L4-5.  Again, he was not having any pain on the right.  He did receive an epidural steroid injection through [facility] and notes that it made all the difference in the world.  He still was having a little discomfort but not enough to interfere with any of his activities.  He has had an exacerbation of a very similar pain that radiates into his left leg and occasionally will get left foot numbness.  He would like to get another cortisone injection.  We will schedule this with Dr. Tessie Eke choice.  We will follow-up as needed but I urged him to call if the injection was not helpful. Also mention having some chronic problems with pain along the medial epicondyles left elbow.  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  I did suggest buying Voltaren gel and if no improvement consider local cortisone injection.  He will let me know if he wants to proceed with the cortisone.  Follow-Up Instructions: Return in about 1 month (around 09/14/2022).   Orders:  Orders Placed This Encounter  Procedures   Ambulatory referral to Physical Medicine Rehab   No orders of the defined types were placed in this encounter.     Procedures: No procedures performed   Clinical Data: No  additional findings.   Subjective: Chief Complaint  Patient presents with   Left Hip - Pain  Patient presents today for left hip pain. He said that he received an ESI injection at [facility] on 06/14/2022. He got great relief for two months. The pain has returned. He has pain that will radiate into his left leg and numbness in his left foot. He wants to know how often he can get these injections.  Had similar symptoms prior to his epidural steroid injection with resolution not having any pain on the right  HPI  Review of Systems   Objective: Vital Signs: There were no vitals taken for this visit.  Physical Exam Constitutional:      Appearance: He is well-developed.  Eyes:     Pupils: Pupils are equal, round, and reactive to light.  Pulmonary:     Effort: Pulmonary effort is normal.  Skin:    General: Skin is warm and dry.  Neurological:     Mental Status: He is alert and oriented to person, place, and time.  Psychiatric:        Behavior: Behavior normal.     Ortho Exam awake alert and oriented x3.  Comfortable sitting in no acute distress.  Left elbow with tenderness over the medial epicondyles.  There was no erythema  or ecchymosis.  No Tinel's over the ulnar nerve.  Full range of motion of elbow.  No tenderness to percussion about the lumbosacral spine or either SI joint.  Right leg raise negative on the right.  It was minimally positive on the left for left thigh and calf discomfort.  This could be related to hamstring tightness.  Reflexes were symmetrical.  Motor and sensory exam intact  Specialty Comments:  No specialty comments available.  Imaging: No results found.   PMFS History: Patient Active Problem List   Diagnosis Date Noted   Pain in left knee 07/26/2020   Family history of colon cancer in father- 67's 10/15/2019   Low back pain 06/30/2019   Precordial chest pain 08/25/2014   Past Medical History:  Diagnosis Date   Allergy    seasonal pollen    Hyperlipidemia    Sleep apnea 2000   Couldn't use CPAP, takes sleep medication to help with this    Family History  Problem Relation Age of Onset   Lymphoma Father        dx in his 22s   Heart disease Brother 40       Died of MI   Heart disease Maternal Uncle 82   Breast cancer Paternal Aunt        dx in her 67s   Heart disease Maternal Grandfather        CAD   Cancer Paternal Grandmother        gall bladder cancer   Colon cancer Neg Hx    Colon polyps Neg Hx    Esophageal cancer Neg Hx    Rectal cancer Neg Hx    Stomach cancer Neg Hx     Past Surgical History:  Procedure Laterality Date   CATARACT EXTRACTION, BILATERAL Bilateral    2018   COLONOSCOPY  2011   EYE SURGERY  2010   pt states vitreous removal to eye, not sure which one   HERNIA REPAIR     bilateral inguinal   HYDROCELE EXCISION / REPAIR  2005   pt not sure which side   [place]   Social History   Occupational History   Not on file  Tobacco Use   Smoking status: Never   Smokeless tobacco: Never  Vaping Use   Vaping Use: Never used  Substance and Sexual Activity   Alcohol use: Yes    Alcohol/week: 1.0 standard drink of alcohol    Types: 1 Cans of beer per week    Comment: rarely   Drug use: No   Sexual activity: Not on file

## 2022-08-27 ENCOUNTER — Ambulatory Visit: Payer: Self-pay

## 2022-08-27 ENCOUNTER — Ambulatory Visit: Payer: Federal, State, Local not specified - PPO | Admitting: Physical Medicine and Rehabilitation

## 2022-08-27 VITALS — BP 120/80 | HR 63

## 2022-08-27 DIAGNOSIS — M5416 Radiculopathy, lumbar region: Secondary | ICD-10-CM

## 2022-08-27 MED ORDER — METHYLPREDNISOLONE ACETATE 80 MG/ML IJ SUSP
40.0000 mg | Freq: Once | INTRAMUSCULAR | Status: AC
  Administered 2022-08-27: 40 mg

## 2022-08-27 NOTE — Progress Notes (Signed)
L L4-L5 interlaminal ESI-

## 2022-09-04 NOTE — Progress Notes (Signed)
Wesley Becker - 66 y.o. male MRN [id number]  Date of birth: 05/22/1956  Office Visit Note: Visit Date: 08/27/2022 PCP: Lavone Orn, MD Referred by: Lavone Orn, MD  Subjective: Chief Complaint  Patient presents with   Lower Back - Pain    Radiates to left leg and foot   HPI:  Wesley Becker is a 66 y.o. male who comes in today at the request of Dr. Joni Fears for planned Left L4-5 Lumbar Interlaminar epidural steroid injection with fluoroscopic guidance.  The patient has failed conservative care including home exercise, medications, time and activity modification.  This injection will be diagnostic and hopefully therapeutic.  Please see requesting physician notes for further details and justification.  Prior epidural injection of the L4-5 from an interlaminar approach in September at [facility] imaging offered quite a bit of relief for him up until recently.  No new trauma etc.  Depending on relief patient should at least consider surgical consultation to see what his options are there.  Can repeat the injection from time to time if it is very successful for several months.   ROS Otherwise per HPI.  Assessment & Plan: Visit Diagnoses:    ICD-10-CM   1. Lumbar radiculopathy  M54.16 XR C-ARM NO REPORT    Epidural Steroid injection    methylPREDNISolone acetate (DEPO-MEDROL) injection 40 mg      Plan: No additional findings.   Meds & Orders:  Meds ordered this encounter  Medications   methylPREDNISolone acetate (DEPO-MEDROL) injection 40 mg    Orders Placed This Encounter  Procedures   XR C-ARM NO REPORT   Epidural Steroid injection    Follow-up: No follow-ups on file.   Procedures: No procedures performed  Lumbar Epidural Steroid Injection - Interlaminar Approach with Fluoroscopic Guidance  Patient: Wesley Becker      Date of Birth: 24-Feb-1956 MRN: [id number] PCP: Lavone Orn, MD      Visit Date: 08/27/2022   Universal Protocol:     Consent Given By: the  patient  Position: PRONE  Additional Comments: Vital signs were monitored before and after the procedure. Patient was prepped and draped in the usual sterile fashion. The correct patient, procedure, and site was verified.   Injection Procedure Details:   Procedure diagnoses: Lumbar radiculopathy [M54.16]   Meds Administered:  Meds ordered this encounter  Medications   methylPREDNISolone acetate (DEPO-MEDROL) injection 40 mg     Laterality: Left  Location/Site:  L4-5  Needle: 3.5 in., 20 ga. Tuohy  Needle Placement: Paramedian epidural  Findings:   -Comments: Excellent flow of contrast into the epidural space.  Procedure Details: Using a paramedian approach from the side mentioned above, the region overlying the inferior lamina was localized under fluoroscopic visualization and the soft tissues overlying this structure were infiltrated with 4 ml. of 1% Lidocaine without Epinephrine. The Tuohy needle was inserted into the epidural space using a paramedian approach.   The epidural space was localized using loss of resistance along with counter oblique bi-planar fluoroscopic views.  After negative aspirate for air, blood, and CSF, a 2 ml. volume of Isovue-250 was injected into the epidural space and the flow of contrast was observed. Radiographs were obtained for documentation purposes.    The injectate was administered into the level noted above.   Additional Comments:  The patient tolerated the procedure well Dressing: 2 x 2 sterile gauze and Band-Aid    Post-procedure details: Patient was observed during the procedure. Post-procedure instructions were reviewed.  Patient left the  clinic in stable condition.   Clinical History: MRI LUMBAR SPINE WITHOUT CONTRAST   TECHNIQUE: Multiplanar, multisequence MR imaging of the lumbar spine was performed. No intravenous contrast was administered.   COMPARISON:  Radiographs of the lumbar spine 06/30/2019    FINDINGS: Segmentation: Transitional lumbosacral anatomy is identified. For the purposes of this examination there is partial sacralization of the L5 vertebra with left-sided assimilation joint and relatively well-formed intervertebral disc at L5-S1.   Alignment: Lumbar levocurvature. Reversal of the expected lumbar lordosis. 2 mm L3-L4 grade 1 retrolisthesis.   Vertebrae: Vertebral body height is maintained. Multilevel degenerative endplate irregularity and Schmorl nodes. Edema surrounding an L2 inferior endplate Schmorl node. Mild mixed degenerative endplate marrow signal at multiple additional levels.   Conus medullaris and cauda equina: Conus extends to the L1 level. No signal abnormality within the visualized distal spinal cord.   Paraspinal and other soft tissues: No abnormality identified within included portions of the abdomen/retroperitoneum. Paraspinal soft tissues within normal limits.   Disc levels:   Moderate L4-L5 disc degeneration. Mild disc degeneration at the remaining levels.   T12-L1: Mild facet arthrosis. No disc herniation. No significant canal or foraminal stenosis.   L1-L2: Disc bulge. Moderate facet arthrosis with ligamentum flavum hypertrophy. Minimal right subarticular narrowing without significant central canal stenosis. No significant neural foraminal narrowing.   L2-L3: Disc bulge asymmetric to the right. Superimposed broad-based right subarticular/foraminal disc protrusion. Associated osteophyte ridge greatest along the right lateral aspect of the disc space. Moderate facet arthrosis. Small amount of asymmetric fluid within the left facet joint. Mild right subarticular narrowing with possible contact upon the descending L3 nerve root (series 14, image 19). No significant central canal stenosis or neural foraminal narrowing.   L3-L4: Grade 1 retrolisthesis. Disc bulge asymmetric to the right. Associated osteophyte ridge greatest along the  right lateral aspect of the disc space. Moderate facet arthrosis with ligamentum flavum hypertrophy. Left subarticular stenosis with medialization of the descending left L4 nerve root. Central canal patent. Mild/moderate left neural foraminal narrowing   L4-L5: Disc bulge asymmetric to the left. Superimposed broad-based central to left foraminal disc protrusion. Associated endplate spurring and osteophyte ridge greatest along the left lateral aspect of the disc space. Prominent facet arthrosis with ligamentum flavum hypertrophy. Bilateral subarticular stenosis with slight crowding of the descending right L5 nerve root and medialization of descending left-sided nerve roots. Moderate/severe left neural foraminal narrowing.   L5-S1: Mild endplate spurring. No disc herniation or significant spinal canal stenosis. Mild right neural foraminal narrowing.   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   Lumbar spondylosis as outlined.   Multilevel multifactorial subarticular stenosis most notably as follows. At L2-L3, right subarticular stenosis with possible contact upon the descending right L3 nerve root. At L4-L5, bilateral subarticular stenosis with slight crowding of the descending right L5 nerve root and medialization of descending left-sided nerve roots. No significant central canal stenosis at any level.   Multilevel neural foraminal narrowing greatest on the left at L3-L4 (mild/moderate) and on the left at L4-L5 (moderate/severe).     Electronically Signed   By: Kellie Simmering DO   On: 09/10/2019 08:46     Objective:  VS:  HT:    WT:   BMI:     BP:120/80  HR:63bpm  TEMP: ( )  RESP:  Physical Exam Vitals and nursing note reviewed.  Constitutional:      General: He is not in acute distress.    Appearance: Normal appearance. He  is not ill-appearing.  HENT:     Head: Normocephalic and atraumatic.     Right Ear: External ear normal.      Left Ear: External ear normal.     Nose: No congestion.  Eyes:     Extraocular Movements: Extraocular movements intact.  Cardiovascular:     Rate and Rhythm: Normal rate.     Pulses: Normal pulses.  Pulmonary:     Effort: Pulmonary effort is normal. No respiratory distress.  Abdominal:     General: There is no distension.     Palpations: Abdomen is soft.  Musculoskeletal:        General: No tenderness or signs of injury.     Cervical back: Neck supple.     Right lower leg: No edema.     Left lower leg: No edema.     Comments: Patient has good distal strength without clonus.  Skin:    Findings: No erythema or rash.  Neurological:     General: No focal deficit present.     Mental Status: He is alert and oriented to person, place, and time.     Sensory: No sensory deficit.     Motor: No weakness or abnormal muscle tone.     Coordination: Coordination normal.  Psychiatric:        Mood and Affect: Mood normal.        Behavior: Behavior normal.      Imaging: No results found.

## 2022-09-04 NOTE — Procedures (Signed)
Lumbar Epidural Steroid Injection - Interlaminar Approach with Fluoroscopic Guidance  Patient: Wesley Becker      Date of Birth: 07-16-1956 MRN: [id number] PCP: Lavone Orn, MD      Visit Date: 08/27/2022   Universal Protocol:     Consent Given By: the patient  Position: PRONE  Additional Comments: Vital signs were monitored before and after the procedure. Patient was prepped and draped in the usual sterile fashion. The correct patient, procedure, and site was verified.   Injection Procedure Details:   Procedure diagnoses: Lumbar radiculopathy [M54.16]   Meds Administered:  Meds ordered this encounter  Medications   methylPREDNISolone acetate (DEPO-MEDROL) injection 40 mg     Laterality: Left  Location/Site:  L4-5  Needle: 3.5 in., 20 ga. Tuohy  Needle Placement: Paramedian epidural  Findings:   -Comments: Excellent flow of contrast into the epidural space.  Procedure Details: Using a paramedian approach from the side mentioned above, the region overlying the inferior lamina was localized under fluoroscopic visualization and the soft tissues overlying this structure were infiltrated with 4 ml. of 1% Lidocaine without Epinephrine. The Tuohy needle was inserted into the epidural space using a paramedian approach.   The epidural space was localized using loss of resistance along with counter oblique bi-planar fluoroscopic views.  After negative aspirate for air, blood, and CSF, a 2 ml. volume of Isovue-250 was injected into the epidural space and the flow of contrast was observed. Radiographs were obtained for documentation purposes.    The injectate was administered into the level noted above.   Additional Comments:  The patient tolerated the procedure well Dressing: 2 x 2 sterile gauze and Band-Aid    Post-procedure details: Patient was observed during the procedure. Post-procedure instructions were reviewed.  Patient left the clinic in stable condition.

## 2022-11-04 DIAGNOSIS — J40 Bronchitis, not specified as acute or chronic: Secondary | ICD-10-CM | POA: Diagnosis not present

## 2022-11-12 ENCOUNTER — Telehealth: Payer: Self-pay | Admitting: Physical Medicine and Rehabilitation

## 2022-11-12 NOTE — Telephone Encounter (Signed)
Patient called needing to schedule an appointment with Dr. Ernestina Patches for an injection in his back. The number to contact patient is [phone]

## 2022-11-13 NOTE — Telephone Encounter (Signed)
Spoke with patient and he stated the last injection did not help, not even 50%. He is having more pain going into the left leg and it is radiating down to the calf. Please advise

## 2022-11-14 NOTE — Telephone Encounter (Signed)
Spoke with patient and scheduled ov for 11/15/22

## 2022-11-15 ENCOUNTER — Encounter: Payer: Self-pay | Admitting: Physical Medicine and Rehabilitation

## 2022-11-15 ENCOUNTER — Ambulatory Visit (INDEPENDENT_AMBULATORY_CARE_PROVIDER_SITE_OTHER): Payer: Medicare Other | Admitting: Physical Medicine and Rehabilitation

## 2022-11-15 DIAGNOSIS — M48061 Spinal stenosis, lumbar region without neurogenic claudication: Secondary | ICD-10-CM

## 2022-11-15 DIAGNOSIS — M4726 Other spondylosis with radiculopathy, lumbar region: Secondary | ICD-10-CM

## 2022-11-15 DIAGNOSIS — M5416 Radiculopathy, lumbar region: Secondary | ICD-10-CM | POA: Diagnosis not present

## 2022-11-15 NOTE — Progress Notes (Signed)
Functional Pain Scale - descriptive words and definitions  Mild (2)   Noticeable when not distracted/no impact on ADL's/sleep only slightly affected and able to   use both passive and active distraction for comfort. Mild range order  Average Pain  varies depending on time of day  Lower back pain. Last injection did not help, only a little. Most pain is in the left calf, but it run from the left hip all the way down

## 2022-11-15 NOTE — Progress Notes (Signed)
Wesley Becker - 67 y.o. male MRN [id number]  Date of birth: 1956-02-15  Office Visit Note: Visit Date: 11/15/2022 PCP: Lavone Orn, MD Referred by: Lavone Orn, MD  Subjective: Chief Complaint  Patient presents with   Lower Back - Pain   HPI: Wesley Becker is a 67 y.o. male who comes in today for evaluation of chronic, worsening and severe bilateral lower back pain radiating down the left posterior lateral leg to ankle. Patient was initially referred to [place] by Dr. Joni Fears. Pain ongoing for several years and worsens with standing, walking and activity. He describes pain as sore and aching sensation, most severe discomfort to left lateral calf/ankle region, currently rates as 7 out of 10.  Some relief of pain with home exercise regimen, rest and use of medications.  Patient is very active, he does ride stationary bike and lift weights several times per week. History of formal physical therapy at [facility] in 2020, this was more for right sided symptoms.  Lumbar MRI imaging from 2020 exhibits multilevel multifactorial subarticular stenosis. At L4-L5 there is bilateral subarticular stenosis with slight crowding of the descending right L5 nerve root and medialization of descending left-sided nerve roots. No high grade spinal canal stenosis noted. History of left L4-L5 interlaminar epidural steroid injection in September 2023 at [place], he reports good relief of pain for 2 months with this procedure. We repeated same injection in our office in November of 2023 with minimal relief of pain. Patient states pain is beginning to negatively impact daily life. He denies focal weakness, numbness and tingling. No recent trauma or falls.    Review of Systems  Musculoskeletal:  Positive for back pain.  Neurological:  Negative for tingling, sensory change, focal weakness and weakness.  All other systems reviewed and are negative.  Otherwise per HPI.  Assessment & Plan: Visit  Diagnoses:    ICD-10-CM   1. Lumbar radiculopathy  M54.16 MR LUMBAR SPINE WO CONTRAST    2. Other spondylosis with radiculopathy, lumbar region  M47.26 MR LUMBAR SPINE WO CONTRAST    3. Stenosis of lateral recess of lumbar spine  M48.061 MR LUMBAR SPINE WO CONTRAST       Plan: Findings:  Chronic, worsening and severe bilateral lower back pain radiating down the left posterior lateral leg to ankle.  Patient continues to have severe pain despite good conservative therapy such as formal physical therapy, home exercise regimen, rest and use of medications patient's clinical presentation and exam are consistent with L5 nerve pattern.  Minimal relief of pain with previous interlaminar lumbar epidural steroid injection in November of last year.  Next step is to obtain new lumbar MRI imaging.  We will have patient follow-up in the office after imaging is obtained to review and discuss treatment options.  Depending on results of MRI imaging we would consider performing left L5 transforaminal injection. Could also look at re-grouping with physical therapy and discuss medications. Patient encouraged to remain active and to continue with over the counter pain medications as needed. No red flag symptoms noted upon exam today.     Meds & Orders: No orders of the defined types were placed in this encounter.   Orders Placed This Encounter  Procedures   MR LUMBAR SPINE WO CONTRAST    Follow-up: Return for follow up for lumbar MRI review.   Procedures: No procedures performed      Clinical History: MRI LUMBAR SPINE WITHOUT CONTRAST   TECHNIQUE: Multiplanar, multisequence MR  imaging of the lumbar spine was performed. No intravenous contrast was administered.   COMPARISON:  Radiographs of the lumbar spine 06/30/2019   FINDINGS: Segmentation: Transitional lumbosacral anatomy is identified. For the purposes of this examination there is partial sacralization of the L5 vertebra with left-sided  assimilation joint and relatively well-formed intervertebral disc at L5-S1.   Alignment: Lumbar levocurvature. Reversal of the expected lumbar lordosis. 2 mm L3-L4 grade 1 retrolisthesis.   Vertebrae: Vertebral body height is maintained. Multilevel degenerative endplate irregularity and Schmorl nodes. Edema surrounding an L2 inferior endplate Schmorl node. Mild mixed degenerative endplate marrow signal at multiple additional levels.   Conus medullaris and cauda equina: Conus extends to the L1 level. No signal abnormality within the visualized distal spinal cord.   Paraspinal and other soft tissues: No abnormality identified within included portions of the abdomen/retroperitoneum. Paraspinal soft tissues within normal limits.   Disc levels:   Moderate L4-L5 disc degeneration. Mild disc degeneration at the remaining levels.   T12-L1: Mild facet arthrosis. No disc herniation. No significant canal or foraminal stenosis.   L1-L2: Disc bulge. Moderate facet arthrosis with ligamentum flavum hypertrophy. Minimal right subarticular narrowing without significant central canal stenosis. No significant neural foraminal narrowing.   L2-L3: Disc bulge asymmetric to the right. Superimposed broad-based right subarticular/foraminal disc protrusion. Associated osteophyte ridge greatest along the right lateral aspect of the disc space. Moderate facet arthrosis. Small amount of asymmetric fluid within the left facet joint. Mild right subarticular narrowing with possible contact upon the descending L3 nerve root (series 14, image 19). No significant central canal stenosis or neural foraminal narrowing.   L3-L4: Grade 1 retrolisthesis. Disc bulge asymmetric to the right. Associated osteophyte ridge greatest along the right lateral aspect of the disc space. Moderate facet arthrosis with ligamentum flavum hypertrophy. Left subarticular stenosis with medialization of the descending left L4 nerve  root. Central canal patent. Mild/moderate left neural foraminal narrowing   L4-L5: Disc bulge asymmetric to the left. Superimposed broad-based central to left foraminal disc protrusion. Associated endplate spurring and osteophyte ridge greatest along the left lateral aspect of the disc space. Prominent facet arthrosis with ligamentum flavum hypertrophy. Bilateral subarticular stenosis with slight crowding of the descending right L5 nerve root and medialization of descending left-sided nerve roots. Moderate/severe left neural foraminal narrowing.   L5-S1: Mild endplate spurring. No disc herniation or significant spinal canal stenosis. Mild right neural foraminal narrowing.   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   Lumbar spondylosis as outlined.   Multilevel multifactorial subarticular stenosis most notably as follows. At L2-L3, right subarticular stenosis with possible contact upon the descending right L3 nerve root. At L4-L5, bilateral subarticular stenosis with slight crowding of the descending right L5 nerve root and medialization of descending left-sided nerve roots. No significant central canal stenosis at any level.   Multilevel neural foraminal narrowing greatest on the left at L3-L4 (mild/moderate) and on the left at L4-L5 (moderate/severe).     Electronically Signed   By: Kellie Simmering DO   On: 09/10/2019 08:46   He reports that he has never smoked. He has never used smokeless tobacco. No results for input(s): "HGBA1C", "LABURIC" in the last 8760 hours.  Objective:  VS:  HT:    WT:   BMI:     BP:   HR: bpm  TEMP: ( )  RESP:  Physical Exam Vitals and nursing note reviewed.  HENT:     Head: Normocephalic and atraumatic.     Right  Ear: External ear normal.     Left Ear: External ear normal.     Nose: Nose normal.     Mouth/Throat:     Mouth: Mucous membranes are moist.  Eyes:     Extraocular Movements: Extraocular  movements intact.  Cardiovascular:     Rate and Rhythm: Normal rate.     Pulses: Normal pulses.  Pulmonary:     Effort: Pulmonary effort is normal.  Abdominal:     General: Abdomen is flat. There is no distension.  Musculoskeletal:        General: Tenderness present.     Cervical back: Normal range of motion.     Comments: Pt rises from seated position to standing without difficulty. Good lumbar range of motion. Strong distal strength without clonus, no pain upon palpation of greater trochanters. Sensation intact bilaterally. Dysesthesias noted to left L5 dermatome. Walks independently, gait steady.   Skin:    General: Skin is warm and dry.     Capillary Refill: Capillary refill takes less than 2 seconds.  Neurological:     General: No focal deficit present.     Mental Status: He is alert and oriented to person, place, and time.  Psychiatric:        Mood and Affect: Mood normal.        Behavior: Behavior normal.     Ortho Exam  Imaging: No results found.  Past Medical/Family/Surgical/Social History: Medications & Allergies reviewed per EMR, new medications updated. Patient Active Problem List   Diagnosis Date Noted   Pain in left knee 07/26/2020   Family history of colon cancer in father- 64's 10/15/2019   Low back pain 06/30/2019   Precordial chest pain 08/25/2014   Past Medical History:  Diagnosis Date   Allergy    seasonal pollen   Hyperlipidemia    Sleep apnea 2000   Couldn't use CPAP, takes sleep medication to help with this   Family History  Problem Relation Age of Onset   Lymphoma Father        dx in his 83s   Heart disease Brother 84       Died of MI   Heart disease Maternal Uncle 67   Breast cancer Paternal Aunt        dx in her 52s   Heart disease Maternal Grandfather        CAD   Cancer Paternal Grandmother        gall bladder cancer   Colon cancer Neg Hx    Colon polyps Neg Hx    Esophageal cancer Neg Hx    Rectal cancer Neg Hx    Stomach  cancer Neg Hx    Past Surgical History:  Procedure Laterality Date   CATARACT EXTRACTION, BILATERAL Bilateral    2018   COLONOSCOPY  2011   EYE SURGERY  2010   pt states vitreous removal to eye, not sure which one   HERNIA REPAIR     bilateral inguinal   HYDROCELE EXCISION / REPAIR  2005   pt not sure which side   [place]   Social History   Occupational History   Not on file  Tobacco Use   Smoking status: Never   Smokeless tobacco: Never  Vaping Use   Vaping Use: Never used  Substance and Sexual Activity   Alcohol use: Yes    Alcohol/week: 1.0 standard drink of alcohol  Types: 1 Cans of beer per week    Comment: rarely   Drug use: No   Sexual activity: Not on file

## 2022-11-16 ENCOUNTER — Telehealth: Payer: Self-pay

## 2022-11-16 NOTE — Telephone Encounter (Signed)
Spoke with patient and he stated Jinny Blossom informed him that she would just call him with results and treatment plan without him coming in for an OV. Patient has MRI scheduled for 11/21/22

## 2022-11-21 ENCOUNTER — Ambulatory Visit
Admission: RE | Admit: 2022-11-21 | Discharge: 2022-11-21 | Disposition: A | Discharge status: Home / Self Care | Payer: Medicare Other | Source: Ambulatory Visit | Attending: Physical Medicine and Rehabilitation | Admitting: Physical Medicine and Rehabilitation

## 2022-11-21 ENCOUNTER — Other Ambulatory Visit: Payer: Self-pay | Admitting: Physical Medicine and Rehabilitation

## 2022-11-21 DIAGNOSIS — M48061 Spinal stenosis, lumbar region without neurogenic claudication: Secondary | ICD-10-CM

## 2022-11-21 DIAGNOSIS — M5416 Radiculopathy, lumbar region: Secondary | ICD-10-CM

## 2022-11-21 DIAGNOSIS — M4726 Other spondylosis with radiculopathy, lumbar region: Secondary | ICD-10-CM

## 2022-11-21 NOTE — Progress Notes (Signed)
I spoke with patient this morning via telephone, did discuss results of new lumbar MRI imaging. Findings at L4-L5 have worsened since imaging in 2020. I did place order for transforaminal injection. We will call him to schedule when approved by insurance.

## 2022-11-22 ENCOUNTER — Telehealth: Payer: Self-pay | Admitting: Physical Medicine and Rehabilitation

## 2022-11-22 NOTE — Telephone Encounter (Signed)
Patient called asked for a call back concerning scheduling an appointment with Dr Ernestina Patches. The number to contact patient is [phone]

## 2022-11-23 NOTE — Telephone Encounter (Signed)
Spoke with patient and scheduled injection for 12/04/22

## 2022-12-04 ENCOUNTER — Ambulatory Visit (INDEPENDENT_AMBULATORY_CARE_PROVIDER_SITE_OTHER): Payer: Medicare Other | Admitting: Physical Medicine and Rehabilitation

## 2022-12-04 ENCOUNTER — Ambulatory Visit: Payer: Self-pay

## 2022-12-04 VITALS — BP 129/87 | HR 70

## 2022-12-04 DIAGNOSIS — M5416 Radiculopathy, lumbar region: Secondary | ICD-10-CM

## 2022-12-04 MED ORDER — METHYLPREDNISOLONE ACETATE 80 MG/ML IJ SUSP
80.0000 mg | Freq: Once | INTRAMUSCULAR | Status: AC
  Administered 2022-12-04: 80 mg

## 2022-12-04 NOTE — Patient Instructions (Signed)

## 2022-12-04 NOTE — Progress Notes (Signed)
Functional Pain Scale - descriptive words and definitions  Mild (2)   Noticeable when not distracted/no impact on ADL's/sleep only slightly affected and able to   use both passive and active distraction for comfort. Mild range order  Average Pain  varies   +Driver, -BT, -Dye Allergies.  Lower back pain with main pain in left leg in the calf

## 2022-12-05 NOTE — Progress Notes (Signed)
Wesley Becker - 67 y.o. male MRN [id number]  Date of birth: [birth year]  Office Visit Note: Visit Date: 12/04/2022 PCP: Lavone Orn, MD Referred by: Lavone Orn, MD  Subjective: Chief Complaint  Patient presents with   Lower Back - Pain   HPI:  Wesley Becker is a 67 y.o. male who comes in today at the request of Barnet Pall, FNP for planned Left L5-S1 Lumbar Transforaminal epidural steroid injection with fluoroscopic guidance.  The patient has failed conservative care including home exercise, medications, time and activity modification.  This injection will be diagnostic and hopefully therapeutic.  Please see requesting physician notes for further details and justification.   ROS Otherwise per HPI.  Assessment & Plan: Visit Diagnoses:    ICD-10-CM   1. Lumbar radiculopathy  M54.16 XR C-ARM NO REPORT    Epidural Steroid injection    methylPREDNISolone acetate (DEPO-MEDROL) injection 80 mg      Plan: No additional findings.   Meds & Orders:  Meds ordered this encounter  Medications   methylPREDNISolone acetate (DEPO-MEDROL) injection 80 mg    Orders Placed This Encounter  Procedures   XR C-ARM NO REPORT   Epidural Steroid injection    Follow-up: Return for visit to requesting provider as needed.   Procedures: No procedures performed  Lumbosacral Transforaminal Epidural Steroid Injection - Sub-Pedicular Approach with Fluoroscopic Guidance  Patient: Wesley Becker      Date of Birth: 1956-01-25 MRN: [id number] PCP: Lavone Orn, MD      Visit Date: 12/04/2022   Universal Protocol:    Date/Time: 12/04/2022  Consent Given By: the patient  Position: PRONE  Additional Comments: Vital signs were monitored before and after the procedure. Patient was prepped and draped in the usual sterile fashion. The correct patient, procedure, and site was verified.   Injection Procedure Details:   Procedure diagnoses: Lumbar radiculopathy [M54.16]    Meds Administered:   Meds ordered this encounter  Medications   methylPREDNISolone acetate (DEPO-MEDROL) injection 80 mg    Laterality: Left  Location/Site: L5  Needle:5.0 in., 22 ga.  Short bevel or Quincke spinal needle  Needle Placement: Transforaminal  Findings:    -Comments: Excellent flow of contrast along the nerve, nerve root and into the epidural space.  Procedure Details: After squaring off the end-plates to get a true AP view, the C-arm was positioned so that an oblique view of the foramen as noted above was visualized. The target area is just inferior to the "nose of the scotty dog" or sub pedicular. The soft tissues overlying this structure were infiltrated with 2-3 ml. of 1% Lidocaine without Epinephrine.  The spinal needle was inserted toward the target using a "trajectory" view along the fluoroscope beam.  Under AP and lateral visualization, the needle was advanced so it did not puncture dura and was located close the 6 O'Clock position of the pedical in AP tracterory. Biplanar projections were used to confirm position. Aspiration was confirmed to be negative for CSF and/or blood. A 1-2 ml. volume of Isovue-250 was injected and flow of contrast was noted at each level. Radiographs were obtained for documentation purposes.   After attaining the desired flow of contrast documented above, a 0.5 to 1.0 ml test dose of 0.25% Marcaine was injected into each respective transforaminal space.  The patient was observed for 90 seconds post injection.  After no sensory deficits were reported, and normal lower extremity motor function was noted,   the above injectate was administered so that equal amounts  of the injectate were placed at each foramen (level) into the transforaminal epidural space.   Additional Comments:  No complications occurred Dressing: 2 x 2 sterile gauze and Band-Aid    Post-procedure details: Patient was observed during the procedure. Post-procedure instructions were  reviewed.  Patient left the clinic in stable condition.    Clinical History: MRI LUMBAR SPINE WITHOUT CONTRAST   TECHNIQUE: Multiplanar, multisequence MR imaging of the lumbar spine was performed. No intravenous contrast was administered.   COMPARISON:  Radiographs of the lumbar spine 06/30/2019   FINDINGS: Segmentation: Transitional lumbosacral anatomy is identified. For the purposes of this examination there is partial sacralization of the L5 vertebra with left-sided assimilation joint and relatively well-formed intervertebral disc at L5-S1.   Alignment: Lumbar levocurvature. Reversal of the expected lumbar lordosis. 2 mm L3-L4 grade 1 retrolisthesis.   Vertebrae: Vertebral body height is maintained. Multilevel degenerative endplate irregularity and Schmorl nodes. Edema surrounding an L2 inferior endplate Schmorl node. Mild mixed degenerative endplate marrow signal at multiple additional levels.   Conus medullaris and cauda equina: Conus extends to the L1 level. No signal abnormality within the visualized distal spinal cord.   Paraspinal and other soft tissues: No abnormality identified within included portions of the abdomen/retroperitoneum. Paraspinal soft tissues within normal limits.   Disc levels:   Moderate L4-L5 disc degeneration. Mild disc degeneration at the remaining levels.   T12-L1: Mild facet arthrosis. No disc herniation. No significant canal or foraminal stenosis.   L1-L2: Disc bulge. Moderate facet arthrosis with ligamentum flavum hypertrophy. Minimal right subarticular narrowing without significant central canal stenosis. No significant neural foraminal narrowing.   L2-L3: Disc bulge asymmetric to the right. Superimposed broad-based right subarticular/foraminal disc protrusion. Associated osteophyte ridge greatest along the right lateral aspect of the disc space. Moderate facet arthrosis. Small amount of asymmetric fluid within the left facet  joint. Mild right subarticular narrowing with possible contact upon the descending L3 nerve root (series 14, image 19). No significant central canal stenosis or neural foraminal narrowing.   L3-L4: Grade 1 retrolisthesis. Disc bulge asymmetric to the right. Associated osteophyte ridge greatest along the right lateral aspect of the disc space. Moderate facet arthrosis with ligamentum flavum hypertrophy. Left subarticular stenosis with medialization of the descending left L4 nerve root. Central canal patent. Mild/moderate left neural foraminal narrowing   L4-L5: Disc bulge asymmetric to the left. Superimposed broad-based central to left foraminal disc protrusion. Associated endplate spurring and osteophyte ridge greatest along the left lateral aspect of the disc space. Prominent facet arthrosis with ligamentum flavum hypertrophy. Bilateral subarticular stenosis with slight crowding of the descending right L5 nerve root and medialization of descending left-sided nerve roots. Moderate/severe left neural foraminal narrowing.   L5-S1: Mild endplate spurring. No disc herniation or significant spinal canal stenosis. Mild right neural foraminal narrowing.   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   Lumbar spondylosis as outlined.   Multilevel multifactorial subarticular stenosis most notably as follows. At L2-L3, right subarticular stenosis with possible contact upon the descending right L3 nerve root. At L4-L5, bilateral subarticular stenosis with slight crowding of the descending right L5 nerve root and medialization of descending left-sided nerve roots. No significant central canal stenosis at any level.   Multilevel neural foraminal narrowing greatest on the left at L3-L4 (mild/moderate) and on the left at L4-L5 (moderate/severe).     Electronically Signed   By: Kellie Simmering DO   On: 09/10/2019 08:46     Objective:  VS:  HT:    WT:    BMI:     BP:129/87  HR:70bpm  TEMP: ( )  RESP:  Physical Exam Vitals and nursing note reviewed.  Constitutional:      General: He is not in acute distress.    Appearance: Normal appearance. He is not ill-appearing.  HENT:     Head: Normocephalic and atraumatic.     Right Ear: External ear normal.     Left Ear: External ear normal.     Nose: No congestion.  Eyes:     Extraocular Movements: Extraocular movements intact.  Cardiovascular:     Rate and Rhythm: Normal rate.     Pulses: Normal pulses.  Pulmonary:     Effort: Pulmonary effort is normal. No respiratory distress.  Abdominal:     General: There is no distension.     Palpations: Abdomen is soft.  Musculoskeletal:        General: No tenderness or signs of injury.     Cervical back: Neck supple.     Right lower leg: No edema.     Left lower leg: No edema.     Comments: Patient has good distal strength without clonus.  Skin:    Findings: No erythema or rash.  Neurological:     General: No focal deficit present.     Mental Status: He is alert and oriented to person, place, and time.     Sensory: No sensory deficit.     Motor: No weakness or abnormal muscle tone.     Coordination: Coordination normal.  Psychiatric:        Mood and Affect: Mood normal.        Behavior: Behavior normal.      Imaging: No results found.

## 2022-12-05 NOTE — Procedures (Signed)
Lumbosacral Transforaminal Epidural Steroid Injection - Sub-Pedicular Approach with Fluoroscopic Guidance  Patient: Wesley Becker      Date of Birth: 09-Jul-1956 MRN: [id number] PCP: Lavone Orn, MD      Visit Date: 12/04/2022   Universal Protocol:    Date/Time: 12/04/2022  Consent Given By: the patient  Position: PRONE  Additional Comments: Vital signs were monitored before and after the procedure. Patient was prepped and draped in the usual sterile fashion. The correct patient, procedure, and site was verified.   Injection Procedure Details:   Procedure diagnoses: Lumbar radiculopathy [M54.16]    Meds Administered:  Meds ordered this encounter  Medications   methylPREDNISolone acetate (DEPO-MEDROL) injection 80 mg    Laterality: Left  Location/Site: L5  Needle:5.0 in., 22 ga.  Short bevel or Quincke spinal needle  Needle Placement: Transforaminal  Findings:    -Comments: Excellent flow of contrast along the nerve, nerve root and into the epidural space.  Procedure Details: After squaring off the end-plates to get a true AP view, the C-arm was positioned so that an oblique view of the foramen as noted above was visualized. The target area is just inferior to the "nose of the scotty dog" or sub pedicular. The soft tissues overlying this structure were infiltrated with 2-3 ml. of 1% Lidocaine without Epinephrine.  The spinal needle was inserted toward the target using a "trajectory" view along the fluoroscope beam.  Under AP and lateral visualization, the needle was advanced so it did not puncture dura and was located close the 6 O'Clock position of the pedical in AP tracterory. Biplanar projections were used to confirm position. Aspiration was confirmed to be negative for CSF and/or blood. A 1-2 ml. volume of Isovue-250 was injected and flow of contrast was noted at each level. Radiographs were obtained for documentation purposes.   After attaining the desired flow of  contrast documented above, a 0.5 to 1.0 ml test dose of 0.25% Marcaine was injected into each respective transforaminal space.  The patient was observed for 90 seconds post injection.  After no sensory deficits were reported, and normal lower extremity motor function was noted,   the above injectate was administered so that equal amounts of the injectate were placed at each foramen (level) into the transforaminal epidural space.   Additional Comments:  No complications occurred Dressing: 2 x 2 sterile gauze and Band-Aid    Post-procedure details: Patient was observed during the procedure. Post-procedure instructions were reviewed.  Patient left the clinic in stable condition.

## 2023-08-15 ENCOUNTER — Telehealth: Payer: Self-pay | Admitting: Physical Medicine and Rehabilitation

## 2023-08-15 NOTE — Telephone Encounter (Signed)
Pt would like to set up an appt about an injection. Please advise

## 2023-08-16 ENCOUNTER — Other Ambulatory Visit: Payer: Self-pay | Admitting: Physical Medicine and Rehabilitation

## 2023-08-16 DIAGNOSIS — M5416 Radiculopathy, lumbar region: Secondary | ICD-10-CM

## 2023-08-17 ENCOUNTER — Ambulatory Visit
Admission: RE | Admit: 2023-08-17 | Discharge: 2023-08-17 | Disposition: A | Discharge status: Home / Self Care | Payer: Medicare Other | Source: Ambulatory Visit | Attending: Family Medicine | Admitting: Family Medicine

## 2023-08-17 ENCOUNTER — Other Ambulatory Visit: Payer: Self-pay

## 2023-08-17 VITALS — BP 134/79 | HR 70 | Temp 97.8°F | Resp 16 | Ht 75.5 in | Wt 220.0 lb

## 2023-08-17 DIAGNOSIS — M5416 Radiculopathy, lumbar region: Secondary | ICD-10-CM | POA: Diagnosis not present

## 2023-08-17 DIAGNOSIS — M4726 Other spondylosis with radiculopathy, lumbar region: Secondary | ICD-10-CM

## 2023-08-17 MED ORDER — TIZANIDINE HCL 4 MG PO TABS
4.0000 mg | ORAL_TABLET | Freq: Four times a day (QID) | ORAL | 0 refills | Status: AC | PRN
Start: 2023-08-17 — End: ?

## 2023-08-17 MED ORDER — TRAMADOL HCL 50 MG PO TABS: 50.0000 mg | ORAL_TABLET | Freq: Four times a day (QID) | ORAL | 0 refills | Status: AC | PRN

## 2023-08-17 MED ORDER — METHYLPREDNISOLONE 4 MG PO TBPK: ORAL_TABLET | ORAL | 0 refills | Status: AC

## 2023-08-17 NOTE — ED Triage Notes (Signed)
Patient c/o right leg pain, starts around at the his right lower back x 4 days.  Pain radiates down leg, some numbness.  No apparent injury to the leg.  Patient does have a history of sciatica with the left leg.  Patient has taken Ibuprofen and Tylenol.

## 2023-08-17 NOTE — ED Provider Notes (Signed)
[facility]    CSN: [id number] Arrival date & time: 08/17/23  0836      History   Chief Complaint Chief Complaint  Patient presents with   Leg Pain    Entered by patient    HPI Wesley Becker is a 67 y.o. male.   Patient has known lumbar degenerative disc disease.  He has been under the care of a specialist for many years.  He has had 3 epidural steroids for left-sided sciatica.  He is currently here because he has right leg pain.  It is different than his left leg pain.  He states that it is more down the front of his left leg.  He has some prickly feeling of the skin and decreased sensation.  Strength is good.  No problems with bowel or bladder.  No injury or strain that he can recall.  He states he is done a lot of yard work.  He does have a back specialist that he is able to see.    Past Medical History:  Diagnosis Date   Allergy    seasonal pollen   Hyperlipidemia    Sleep apnea 2000   Couldn't use CPAP, takes sleep medication to help with this    Patient Active Problem List   Diagnosis Date Noted   Pain in left knee 07/26/2020   Family history of colon cancer in father- 40's 10/15/2019   Low back pain 06/30/2019   Precordial chest pain 08/25/2014    Past Surgical History:  Procedure Laterality Date   CATARACT EXTRACTION, BILATERAL Bilateral    2018   COLONOSCOPY  2011   EYE SURGERY  2010   pt states vitreous removal to eye, not sure which one   HERNIA REPAIR     bilateral inguinal   HYDROCELE EXCISION / REPAIR  2005   pt not sure which side   PROSTATE BIOPSY     REFRACTIVE SURGERY     VASECTOMY  1995       Home Medications    Prior to Admission medications   Medication Sig Start Date End Date Taking? Authorizing Provider  cetirizine (ZYRTEC) 10 MG tablet Take 10 mg by mouth daily.   Yes [provider]  methylPREDNISolone (MEDROL DOSEPAK) 4 MG TBPK tablet tad 08/17/23  Yes Eustace Moore, MD  rosuvastatin (CRESTOR) 20 MG  tablet Take 20 mg by mouth daily. 07/23/22  Yes [provider]  tiZANidine (ZANAFLEX) 4 MG tablet Take 1-2 tablets (4-8 mg total) by mouth every 6 (six) hours as needed for muscle spasms. 08/17/23  Yes Eustace Moore, MD  traMADol (ULTRAM) 50 MG tablet Take 1 tablet (50 mg total) by mouth every 6 (six) hours as needed. 08/17/23  Yes Eustace Moore, MD  traZODone (DESYREL) 50 MG tablet Take 50 mg by mouth at bedtime. 1/2 tablet PO   Yes [provider]  zolpidem (AMBIEN) 5 MG tablet Take 5 mg by mouth at bedtime as needed for sleep.   Yes [provider]    Family History Family History  Problem Relation Age of Onset   Lymphoma Father        dx in his 96s   Heart disease Brother 38       Died of MI   Heart disease Maternal Uncle 30   Breast cancer Paternal Aunt        dx in her 34s   Heart disease Maternal Grandfather  CAD   Cancer Paternal Grandmother        gall bladder cancer   Colon cancer Neg Hx    Colon polyps Neg Hx    Esophageal cancer Neg Hx    Rectal cancer Neg Hx    Stomach cancer Neg Hx     Social History Social History   Tobacco Use   Smoking status: Never   Smokeless tobacco: Never  Vaping Use   Vaping status: Never Used  Substance Use Topics   Alcohol use: Yes    Alcohol/week: 1.0 standard drink of alcohol    Types: 1 Cans of beer per week    Comment: rarely   Drug use: No     Allergies   Patient has no known allergies.   Review of Systems Review of Systems See HPI  Physical Exam Triage Vital Signs ED Triage Vitals  Encounter Vitals Group     BP 08/17/23 0849 134/79     Systolic BP Percentile --      Diastolic BP Percentile --      Pulse Rate 08/17/23 0849 70     Resp 08/17/23 0849 16     Temp 08/17/23 0849 97.8 F (36.6 C)     Temp Source 08/17/23 0849 Oral     SpO2 08/17/23 0849 97 %     Weight 08/17/23 0851 220 lb (99.8 kg)     Height 08/17/23 0851 6' 3.5" (1.918 m)     Head Circumference --       Peak Flow --      Pain Score 08/17/23 0850 4     Pain Loc --      Pain Education --      Exclude from Growth Chart --    No data found.  Updated Vital Signs BP 134/79 (BP Location: Right Arm)   Pulse 70   Temp 97.8 F (36.6 C) (Oral)   Resp 16   Ht 6' 3.5" (1.918 m)   Wt 99.8 kg   SpO2 97%   BMI 27.14 kg/m      Physical Exam Constitutional:      General: He is not in acute distress.    Appearance: He is well-developed and normal weight. He is not ill-appearing.  HENT:     Head: Normocephalic and atraumatic.  Eyes:     Conjunctiva/sclera: Conjunctivae normal.     Pupils: Pupils are equal, round, and reactive to light.  Cardiovascular:     Rate and Rhythm: Normal rate.  Pulmonary:     Effort: Pulmonary effort is normal. No respiratory distress.  Abdominal:     General: There is no distension.     Palpations: Abdomen is soft.  Musculoskeletal:        General: Tenderness present. Normal range of motion.     Cervical back: Normal range of motion.     Comments: Mild tenderness in the right lumbar column of muscles.  No bony tenderness.  Slow but full range of motion.  Straight leg raise on the right causes increased anterior leg pain and "tight" sensation.  Reflexes 2+ at the knee and ankle on the left side, 1+ at the knee and 2+ at the ankle on the right side.  Strength is intact  Skin:    General: Skin is warm and dry.  Neurological:     Mental Status: He is alert.     Deep Tendon Reflexes: Reflexes abnormal.      UC Treatments / Results  Labs (all  labs ordered are listed, but only abnormal results are displayed) Labs Reviewed - No data to display  EKG   Radiology No results found.  Procedures Procedures (including critical care time)  Medications Ordered in UC Medications - No data to display  Initial Impression / Assessment and Plan / UC Course  I have reviewed the triage vital signs and the nursing notes.  Pertinent labs & imaging results that  were available during my care of the patient were reviewed by me and considered in my medical decision making (see chart for details).     Patient had an MRI done in February 2024.  At that time it was identified that he had a left L5 nerve impingement.  He also had physical findings on the MRI that would support right sided 3 for nerve impingement.  Somehow this nerve root has been inflamed.  Discussed conservative treatment and follow-up Final Clinical Impressions(s) / UC Diagnoses   Final diagnoses:  Osteoarthritis of spine with radiculopathy, lumbar region  Lumbar back pain with radiculopathy affecting right lower extremity     Discharge Instructions      Activity as tolerated Take the Medrol Dosepak as directed.  Take all of day 1 today Take tizanidine at bedtime to help with sleep.  Take 1 pill, if not effective take a second pill 1 hour later See your doctor next week in follow-up   ED Prescriptions     Medication Sig Dispense Auth. Provider   methylPREDNISolone (MEDROL DOSEPAK) 4 MG TBPK tablet tad 21 tablet Eustace Moore, MD   tiZANidine (ZANAFLEX) 4 MG tablet Take 1-2 tablets (4-8 mg total) by mouth every 6 (six) hours as needed for muscle spasms. 21 tablet Eustace Moore, MD   traMADol (ULTRAM) 50 MG tablet Take 1 tablet (50 mg total) by mouth every 6 (six) hours as needed. 10 tablet Eustace Moore, MD      I have reviewed the PDMP during this encounter.   Eustace Moore, MD 08/17/23 [phone]

## 2023-08-17 NOTE — Discharge Instructions (Signed)
Activity as tolerated Take the Medrol Dosepak as directed.  Take all of day 1 today Take tizanidine at bedtime to help with sleep.  Take 1 pill, if not effective take a second pill 1 hour later See your doctor next week in follow-up

## 2023-08-19 ENCOUNTER — Telehealth: Payer: Self-pay

## 2023-08-19 NOTE — Telephone Encounter (Signed)
Patient called and stated he had and injection in Feb on the right side. It helped him significantly. Pain is now on the left side, he has numbness and tingling on the left side of thigh.  Similar to last time just on opposite side. He would like another injection. Does he need and OV or injection?

## 2023-08-21 ENCOUNTER — Encounter: Payer: Self-pay | Admitting: Physical Medicine and Rehabilitation

## 2023-08-21 ENCOUNTER — Ambulatory Visit: Payer: Medicare Other | Admitting: Physical Medicine and Rehabilitation

## 2023-08-21 ENCOUNTER — Other Ambulatory Visit: Payer: Self-pay

## 2023-08-21 DIAGNOSIS — R202 Paresthesia of skin: Secondary | ICD-10-CM

## 2023-08-21 DIAGNOSIS — S39012A Strain of muscle, fascia and tendon of lower back, initial encounter: Secondary | ICD-10-CM | POA: Diagnosis not present

## 2023-08-21 DIAGNOSIS — M48061 Spinal stenosis, lumbar region without neurogenic claudication: Secondary | ICD-10-CM | POA: Diagnosis not present

## 2023-08-21 DIAGNOSIS — M5416 Radiculopathy, lumbar region: Secondary | ICD-10-CM

## 2023-08-21 MED ORDER — METHYLPREDNISOLONE ACETATE 40 MG/ML IJ SUSP: 40.0000 mg | Freq: Once | INTRAMUSCULAR | Status: AC

## 2023-08-21 NOTE — Progress Notes (Signed)
Wesley Becker - 67 y.o. male MRN [id number]  Date of birth: July 10, 1956  Office Visit Note: Visit Date: 08/21/2023 PCP: Kirby Funk, MD (Inactive) Referred by: Juanda Chance, NP  Subjective: Chief Complaint  Patient presents with   Lower Back - Pain   HPI: Wesley Becker is a 67 y.o. male who comes in today at the request of Ellin Goodie, FNP for planned Right L5-S1 Lumbar Transforaminal epidural steroid injection with fluoroscopic guidance.  The patient has failed conservative care including home exercise, medications, time and activity modification.  This injection will be diagnostic and hopefully therapeutic.  Please see requesting physician notes for further details and justification.  Patient comes in today really for new evaluation because symptoms are right sided now.  Prior injection was on the left side for disc herniation that was predominantly left-sided.  MRI from last year.  No new injuries.  Injections seem to help quite a bit and he was doing very well up until the last several weeks when the right side started bothering him.  It is in a right L5 dermatome perhaps somewhat S1.  Worse with prolonged sitting and activity.  Really limiting daily activities at this point and enough to warrant injection.  No focal weakness or other red flag complaints.  Prior MRI does not show much in the way of right-sided pathology although with paracentral protrusions that could irritate either side. I spent more than 30 minutes speaking face-to-face with the patient with 50% of the time in counseling and discussing coordination of care.      Review of Systems  Musculoskeletal:  Positive for back pain.  Neurological:  Positive for tingling.  All other systems reviewed and are negative.  Otherwise per HPI.  Assessment & Plan: Visit Diagnoses:    ICD-10-CM   1. Lumbar radiculopathy  M54.16 XR C-ARM NO REPORT    Epidural Steroid injection    methylPREDNISolone acetate (DEPO-MEDROL)  injection 40 mg    2. 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 Strain of lumbar region, initial encounter  S39.012A     4. Paresthesia of skin  R20.2        Plan: Findings:  Prior history of successful lumbar epidural injection on the left for left-sided disc herniation and some lateral recess narrowing from disc osteophyte and ligamentum flavum thickening.  Now comes in with similar complaints on the right.  This does seem to be L5 radicular type pain.  We discussed at length next set of actions which I think at this point given no red flag complaints to be a diagnostic L5 transforaminal injection on the right.  Depending on relief would look at updating MRI of the lumbar spine.  We discussed at length surgical considerations and conservative care with exercise and medication.    Meds & Orders:  Meds ordered this encounter  Medications   methylPREDNISolone acetate (DEPO-MEDROL) injection 40 mg    Orders Placed This Encounter  Procedures   XR C-ARM NO REPORT   Epidural Steroid injection    Follow-up: No follow-ups on file.   Procedures: No procedures performed  Lumbosacral Transforaminal Epidural Steroid Injection - Sub-Pedicular Approach with Fluoroscopic Guidance  Patient: Eddy Moronta      Date of Birth: Oct 31, 1955 MRN: [id number] PCP: Kirby Funk, MD (Inactive)      Visit Date: 08/21/2023   Universal Protocol:    Date/Time: 08/21/2023  Consent Given By: the patient  Position: PRONE  Additional Comments:  Vital signs were monitored before and after the procedure. Patient was prepped and draped in the usual sterile fashion. The correct patient, procedure, and site was verified.   Injection Procedure Details:   Procedure diagnoses: Lumbar radiculopathy [M54.16]    Meds Administered:  Meds ordered this encounter  Medications   methylPREDNISolone acetate (DEPO-MEDROL) injection 40 mg    Laterality: Right  Location/Site: L5  Needle:5.0  in., 22 ga.  Short bevel or Quincke spinal needle  Needle Placement: Transforaminal  Findings:    -Comments: Excellent flow of contrast along the nerve, nerve root and into the epidural space.  Procedure Details: After squaring off the end-plates to get a true AP view, the C-arm was positioned so that an oblique view of the foramen as noted above was visualized. The target area is just inferior to the "nose of the scotty dog" or sub pedicular. The soft tissues overlying this structure were infiltrated with 2-3 ml. of 1% Lidocaine without Epinephrine.  The spinal needle was inserted toward the target using a "trajectory" view along the fluoroscope beam.  Under AP and lateral visualization, the needle was advanced so it did not puncture dura and was located close the 6 O'Clock position of the pedical in AP tracterory. Biplanar projections were used to confirm position. Aspiration was confirmed to be negative for CSF and/or blood. A 1-2 ml. volume of Isovue-250 was injected and flow of contrast was noted at each level. Radiographs were obtained for documentation purposes.   After attaining the desired flow of contrast documented above, a 0.5 to 1.0 ml test dose of 0.25% Marcaine was injected into each respective transforaminal space.  The patient was observed for 90 seconds post injection.  After no sensory deficits were reported, and normal lower extremity motor function was noted,   the above injectate was administered so that equal amounts of the injectate were placed at each foramen (level) into the transforaminal epidural space.   Additional Comments:  No complications occurred Dressing: 2 x 2 sterile gauze and Band-Aid    Post-procedure details: Patient was observed during the procedure. Post-procedure instructions were reviewed.  Patient left the clinic in stable condition.    Clinical History: MRI LUMBAR SPINE WITHOUT CONTRAST   TECHNIQUE: Multiplanar, multisequence MR imaging of  the lumbar spine was performed. No intravenous contrast was administered.   COMPARISON:  Radiographs of the lumbar spine 06/30/2019   FINDINGS: Segmentation: Transitional lumbosacral anatomy is identified. For the purposes of this examination there is partial sacralization of the L5 vertebra with left-sided assimilation joint and relatively well-formed intervertebral disc at L5-S1.   Alignment: Lumbar levocurvature. Reversal of the expected lumbar lordosis. 2 mm L3-L4 grade 1 retrolisthesis.   Vertebrae: Vertebral body height is maintained. Multilevel degenerative endplate irregularity and Schmorl nodes. Edema surrounding an L2 inferior endplate Schmorl node. Mild mixed degenerative endplate marrow signal at multiple additional levels.   Conus medullaris and cauda equina: Conus extends to the L1 level. No signal abnormality within the visualized distal spinal cord.   Paraspinal and other soft tissues: No abnormality identified within included portions of the abdomen/retroperitoneum. Paraspinal soft tissues within normal limits.   Disc levels:   Moderate L4-L5 disc degeneration. Mild disc degeneration at the remaining levels.   T12-L1: Mild facet arthrosis. No disc herniation. No significant canal or foraminal stenosis.   L1-L2: Disc bulge. Moderate facet arthrosis with ligamentum flavum hypertrophy. Minimal right subarticular narrowing without significant central canal stenosis. No significant neural foraminal narrowing.   L2-L3: Disc  bulge asymmetric to the right. Superimposed broad-based right subarticular/foraminal disc protrusion. Associated osteophyte ridge greatest along the right lateral aspect of the disc space. Moderate facet arthrosis. Small amount of asymmetric fluid within the left facet joint. Mild right subarticular narrowing with possible contact upon the descending L3 nerve root (series 14, image 19). No significant central canal stenosis or neural  foraminal narrowing.   L3-L4: Grade 1 retrolisthesis. Disc bulge asymmetric to the right. Associated osteophyte ridge greatest along the right lateral aspect of the disc space. Moderate facet arthrosis with ligamentum flavum hypertrophy. Left subarticular stenosis with medialization of the descending left L4 nerve root. Central canal patent. Mild/moderate left neural foraminal narrowing   L4-L5: Disc bulge asymmetric to the left. Superimposed broad-based central to left foraminal disc protrusion. Associated endplate spurring and osteophyte ridge greatest along the left lateral aspect of the disc space. Prominent facet arthrosis with ligamentum flavum hypertrophy. Bilateral subarticular stenosis with slight crowding of the descending right L5 nerve root and medialization of descending left-sided nerve roots. Moderate/severe left neural foraminal narrowing.   L5-S1: Mild endplate spurring. No disc herniation or significant spinal canal stenosis. Mild right neural foraminal narrowing.   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   Lumbar spondylosis as outlined.   Multilevel multifactorial subarticular stenosis most notably as follows. At L2-L3, right subarticular stenosis with possible contact upon the descending right L3 nerve root. At L4-L5, bilateral subarticular stenosis with slight crowding of the descending right L5 nerve root and medialization of descending left-sided nerve roots. No significant central canal stenosis at any level.   Multilevel neural foraminal narrowing greatest on the left at L3-L4 (mild/moderate) and on the left at L4-L5 (moderate/severe).     Electronically Signed   By: Jackey Loge DO   On: 09/10/2019 08:46   He reports that he has never smoked. He has never used smokeless tobacco. No results for input(s): "HGBA1C", "LABURIC" in the last 8760 hours.  Objective:  VS:  HT:    WT:   BMI:     BP:   HR: bpm   TEMP: ( )  RESP:  Physical Exam Vitals and nursing note reviewed.  Constitutional:      General: He is not in acute distress.    Appearance: Normal appearance. He is not ill-appearing.  HENT:     Head: Normocephalic and atraumatic.     Right Ear: External ear normal.     Left Ear: External ear normal.     Nose: No congestion.  Eyes:     Extraocular Movements: Extraocular movements intact.  Cardiovascular:     Rate and Rhythm: Normal rate.     Pulses: Normal pulses.  Pulmonary:     Effort: Pulmonary effort is normal. No respiratory distress.  Abdominal:     General: There is no distension.     Palpations: Abdomen is soft.  Musculoskeletal:        General: No tenderness or signs of injury.     Cervical back: Neck supple.     Right lower leg: No edema.     Left lower leg: No edema.     Comments: Patient has good distal strength without clonus.  Positive slump test on the right.  Some pain with extension and facet loading.  Skin:    Findings: No erythema or rash.  Neurological:     General: No focal deficit present.     Mental Status: He is alert and oriented to person, place, and time.  Cranial Nerves: No cranial nerve deficit.     Sensory: Sensory deficit present.     Motor: No weakness or abnormal muscle tone.     Coordination: Coordination normal.     Gait: Gait abnormal.  Psychiatric:        Mood and Affect: Mood normal.        Behavior: Behavior normal.     Ortho Exam  Imaging: No results found.  Past Medical/Family/Surgical/Social History: Medications & Allergies reviewed per EMR, new medications updated. Patient Active Problem List   Diagnosis Date Noted   Pain in left knee 07/26/2020   Family history of colon cancer in father- 67's 10/15/2019   Low back pain 06/30/2019   Precordial chest pain 08/25/2014   Past Medical History:  Diagnosis Date   Allergy    seasonal pollen   Hyperlipidemia    Sleep apnea 2000   Couldn't use CPAP, takes sleep  medication to help with this   Family History  Problem Relation Age of Onset   Lymphoma Father        dx in his 6s   Heart disease Brother 7       Died of MI   Heart disease Maternal Uncle 31   Breast cancer Paternal Aunt        dx in her 66s   Heart disease Maternal Grandfather        CAD   Cancer Paternal Grandmother        gall bladder cancer   Colon cancer Neg Hx    Colon polyps Neg Hx    Esophageal cancer Neg Hx    Rectal cancer Neg Hx    Stomach cancer Neg Hx    Past Surgical History:  Procedure Laterality Date   CATARACT EXTRACTION, BILATERAL Bilateral    2018   COLONOSCOPY  2011   EYE SURGERY  2010   pt states vitreous removal to eye, not sure which one   HERNIA REPAIR     bilateral inguinal   HYDROCELE EXCISION / REPAIR  2005   pt not sure which side   PROSTATE BIOPSY     REFRACTIVE SURGERY     VASECTOMY  1995   Social History   Occupational History   Not on file  Tobacco Use   Smoking status: Never   Smokeless tobacco: Never  Vaping Use   Vaping status: Never Used  Substance and Sexual Activity   Alcohol use: Yes    Alcohol/week: 1.0 standard drink of alcohol    Types: 1 Cans of beer per week    Comment: rarely   Drug use: No   Sexual activity: Not Currently

## 2023-08-21 NOTE — Progress Notes (Signed)
Functional Pain Scale - descriptive words and definitions  Moderate (4)   Constantly aware of pain, can complete ADLs with modification/sleep marginally affected at times/passive distraction is of no use, but active distraction gives some relief. Moderate range order  Average Pain 4  Pain is more when sitting down.  R side.  +Driver, -BT, -Dye Allergies.

## 2023-08-21 NOTE — Patient Instructions (Signed)

## 2023-09-02 NOTE — Procedures (Signed)
Lumbosacral Transforaminal Epidural Steroid Injection - Sub-Pedicular Approach with Fluoroscopic Guidance  Patient: Wesley Becker      Date of Birth: 02/16/1956 MRN: [id number] PCP: Kirby Funk, MD (Inactive)      Visit Date: 08/21/2023   Universal Protocol:    Date/Time: 08/21/2023  Consent Given By: the patient  Position: PRONE  Additional Comments: Vital signs were monitored before and after the procedure. Patient was prepped and draped in the usual sterile fashion. The correct patient, procedure, and site was verified.   Injection Procedure Details:   Procedure diagnoses: Lumbar radiculopathy [M54.16]    Meds Administered:  Meds ordered this encounter  Medications   methylPREDNISolone acetate (DEPO-MEDROL) injection 40 mg    Laterality: Right  Location/Site: L5  Needle:5.0 in., 22 ga.  Short bevel or Quincke spinal needle  Needle Placement: Transforaminal  Findings:    -Comments: Excellent flow of contrast along the nerve, nerve root and into the epidural space.  Procedure Details: After squaring off the end-plates to get a true AP view, the C-arm was positioned so that an oblique view of the foramen as noted above was visualized. The target area is just inferior to the "nose of the scotty dog" or sub pedicular. The soft tissues overlying this structure were infiltrated with 2-3 ml. of 1% Lidocaine without Epinephrine.  The spinal needle was inserted toward the target using a "trajectory" view along the fluoroscope beam.  Under AP and lateral visualization, the needle was advanced so it did not puncture dura and was located close the 6 O'Clock position of the pedical in AP tracterory. Biplanar projections were used to confirm position. Aspiration was confirmed to be negative for CSF and/or blood. A 1-2 ml. volume of Isovue-250 was injected and flow of contrast was noted at each level. Radiographs were obtained for documentation purposes.   After attaining the  desired flow of contrast documented above, a 0.5 to 1.0 ml test dose of 0.25% Marcaine was injected into each respective transforaminal space.  The patient was observed for 90 seconds post injection.  After no sensory deficits were reported, and normal lower extremity motor function was noted,   the above injectate was administered so that equal amounts of the injectate were placed at each foramen (level) into the transforaminal epidural space.   Additional Comments:  No complications occurred Dressing: 2 x 2 sterile gauze and Band-Aid    Post-procedure details: Patient was observed during the procedure. Post-procedure instructions were reviewed.  Patient left the clinic in stable condition.

## 2023-10-25 ENCOUNTER — Other Ambulatory Visit: Payer: Self-pay | Admitting: Urology

## 2023-10-25 DIAGNOSIS — R972 Elevated prostate specific antigen [PSA]: Secondary | ICD-10-CM

## 2023-11-08 ENCOUNTER — Ambulatory Visit
Admission: RE | Admit: 2023-11-08 | Discharge: 2023-11-08 | Disposition: A | Discharge status: Home / Self Care | Payer: Medicare Other | Source: Ambulatory Visit | Attending: Urology | Admitting: Urology

## 2023-11-08 DIAGNOSIS — R972 Elevated prostate specific antigen [PSA]: Secondary | ICD-10-CM

## 2023-11-08 MED ORDER — GADOPICLENOL 0.5 MMOL/ML IV SOLN
10.0000 mL | Freq: Once | INTRAVENOUS | Status: AC | PRN
Start: 2023-11-08 — End: 2023-11-08
  Administered 2023-11-08: 10 mL via INTRAVENOUS

## 2024-08-07 ENCOUNTER — Other Ambulatory Visit: Payer: Self-pay | Admitting: Internal Medicine

## 2024-08-07 DIAGNOSIS — E782 Mixed hyperlipidemia: Secondary | ICD-10-CM

## 2024-08-10 ENCOUNTER — Encounter: Payer: Self-pay | Admitting: Radiology

## 2024-08-10 ENCOUNTER — Ambulatory Visit
Admission: RE | Admit: 2024-08-10 | Discharge: 2024-08-10 | Disposition: A | Source: Ambulatory Visit | Attending: Internal Medicine

## 2024-08-10 DIAGNOSIS — E782 Mixed hyperlipidemia: Secondary | ICD-10-CM

## 2024-10-26 ENCOUNTER — Ambulatory Visit (HOSPITAL_COMMUNITY)
Admission: RE | Admit: 2024-10-26 | Discharge: 2024-10-26 | Disposition: A | Discharge status: Home / Self Care | Source: Ambulatory Visit | Attending: Internal Medicine | Admitting: Internal Medicine

## 2024-10-26 ENCOUNTER — Other Ambulatory Visit (HOSPITAL_COMMUNITY): Payer: Self-pay | Admitting: Internal Medicine

## 2024-10-26 DIAGNOSIS — I700 Atherosclerosis of aorta: Secondary | ICD-10-CM

## 2024-10-26 LAB — ECHOCARDIOGRAM COMPLETE
Area-P 1/2: 6.54 cm2
S' Lateral: 2.1 cm
